# Patient Record
Sex: Female | Born: 1975 | Hispanic: No | Marital: Married | Smoking: Never smoker
Health system: Southern US, Community
[De-identification: ages and names within clinical notes are randomized; demographics above are authoritative.]

## PROBLEM LIST (undated history)

## (undated) DIAGNOSIS — D219 Benign neoplasm of connective and other soft tissue, unspecified: Secondary | ICD-10-CM

## (undated) DIAGNOSIS — Z3483 Encounter for supervision of other normal pregnancy, third trimester: Secondary | ICD-10-CM

## (undated) DIAGNOSIS — O09529 Supervision of elderly multigravida, unspecified trimester: Secondary | ICD-10-CM

## (undated) DIAGNOSIS — Z8632 Personal history of gestational diabetes: Secondary | ICD-10-CM

## (undated) DIAGNOSIS — Z789 Other specified health status: Secondary | ICD-10-CM

## (undated) DIAGNOSIS — D649 Anemia, unspecified: Secondary | ICD-10-CM

## (undated) DIAGNOSIS — O09299 Supervision of pregnancy with other poor reproductive or obstetric history, unspecified trimester: Secondary | ICD-10-CM

## (undated) HISTORY — PX: APPENDECTOMY: SHX54

## (undated) HISTORY — DX: Personal history of gestational diabetes: Z86.32

## (undated) HISTORY — DX: Supervision of elderly multigravida, unspecified trimester: O09.529

## (undated) HISTORY — DX: Benign neoplasm of connective and other soft tissue, unspecified: D21.9

## (undated) HISTORY — DX: Supervision of pregnancy with other poor reproductive or obstetric history, unspecified trimester: O09.299

## (undated) HISTORY — DX: Anemia, unspecified: D64.9

## (undated) HISTORY — PX: NO PAST SURGERIES: SHX2092

---

## 2010-07-15 ENCOUNTER — Emergency Department (HOSPITAL_COMMUNITY): Admission: EM | Admit: 2010-07-15 | Discharge: 2010-07-15 | Payer: Self-pay | Admitting: Emergency Medicine

## 2011-01-07 LAB — URINALYSIS, ROUTINE W REFLEX MICROSCOPIC
Glucose, UA: NEGATIVE mg/dL
Hgb urine dipstick: NEGATIVE
Ketones, ur: NEGATIVE mg/dL
pH: 7.5 (ref 5.0–8.0)

## 2011-01-07 LAB — CBC
HCT: 31.9 % — ABNORMAL LOW (ref 36.0–46.0)
Hemoglobin: 10.8 g/dL — ABNORMAL LOW (ref 12.0–15.0)
MCH: 28.7 pg (ref 26.0–34.0)
MCV: 84.5 fL (ref 78.0–100.0)
RBC: 3.77 MIL/uL — ABNORMAL LOW (ref 3.87–5.11)
WBC: 4.6 10*3/uL (ref 4.0–10.5)

## 2011-01-07 LAB — COMPREHENSIVE METABOLIC PANEL
Albumin: 3.4 g/dL — ABNORMAL LOW (ref 3.5–5.2)
Alkaline Phosphatase: 42 U/L (ref 39–117)
BUN: 4 mg/dL — ABNORMAL LOW (ref 6–23)
CO2: 27 mEq/L (ref 19–32)
Chloride: 108 mEq/L (ref 96–112)
GFR calc non Af Amer: >60 mL/min (ref >60)
Glucose, Bld: 98 mg/dL (ref 70–99)
Potassium: 3.5 mEq/L (ref 3.5–5.1)
Total Bilirubin: 0.5 mg/dL (ref 0.3–1.2)

## 2011-01-07 LAB — DIFFERENTIAL
Basophils Absolute: 0 10*3/uL (ref 0.0–0.1)
Basophils Relative: 1 % (ref 0–1)
Monocytes Absolute: 0.3 10*3/uL (ref 0.1–1.0)
Neutro Abs: 3.2 10*3/uL (ref 1.7–7.7)
Neutrophils Relative %: 71 % (ref 43–77)

## 2011-01-07 LAB — URINE CULTURE

## 2011-01-07 LAB — URINE MICROSCOPIC-ADD ON

## 2011-06-13 ENCOUNTER — Emergency Department (HOSPITAL_COMMUNITY)
Admission: EM | Admit: 2011-06-13 | Discharge: 2011-06-13 | Disposition: A | Discharge status: Home / Self Care | Payer: Managed Care, Other (non HMO) | Attending: Emergency Medicine | Admitting: Emergency Medicine

## 2011-06-13 ENCOUNTER — Emergency Department (HOSPITAL_COMMUNITY): Payer: Managed Care, Other (non HMO)

## 2011-06-13 DIAGNOSIS — S6000XA Contusion of unspecified finger without damage to nail, initial encounter: Secondary | ICD-10-CM | POA: Insufficient documentation

## 2011-06-13 DIAGNOSIS — W230XXA Caught, crushed, jammed, or pinched between moving objects, initial encounter: Secondary | ICD-10-CM | POA: Insufficient documentation

## 2011-07-13 ENCOUNTER — Inpatient Hospital Stay (HOSPITAL_COMMUNITY): Payer: Managed Care, Other (non HMO)

## 2011-07-13 ENCOUNTER — Other Ambulatory Visit: Payer: Self-pay | Admitting: Family Medicine

## 2011-07-13 ENCOUNTER — Inpatient Hospital Stay (HOSPITAL_COMMUNITY)
Admission: AD | Admit: 2011-07-13 | Discharge: 2011-07-13 | Disposition: A | Discharge status: Home / Self Care | Payer: Managed Care, Other (non HMO) | Source: Ambulatory Visit | Attending: Obstetrics and Gynecology | Admitting: Obstetrics and Gynecology

## 2011-07-13 ENCOUNTER — Encounter (HOSPITAL_COMMUNITY): Payer: Self-pay

## 2011-07-13 DIAGNOSIS — O039 Complete or unspecified spontaneous abortion without complication: Secondary | ICD-10-CM

## 2011-07-13 DIAGNOSIS — O200 Threatened abortion: Secondary | ICD-10-CM | POA: Insufficient documentation

## 2011-07-13 HISTORY — DX: Other specified health status: Z78.9

## 2011-07-13 LAB — ABO/RH: ABO/RH(D): O POS

## 2011-07-13 LAB — CBC
Hemoglobin: 10.7 g/dL — ABNORMAL LOW (ref 12.0–15.0)
MCHC: 34.3 g/dL (ref 30.0–36.0)
WBC: 5.9 10*3/uL (ref 4.0–10.5)

## 2011-07-13 LAB — HCG, QUANTITATIVE, PREGNANCY: hCG, Beta Chain, Quant, S: 10179 m[IU]/mL — ABNORMAL HIGH (ref <5)

## 2011-07-13 MED ORDER — OXYCODONE-ACETAMINOPHEN 5-325 MG PO TABS
1.0000 | ORAL_TABLET | Freq: Once | ORAL | Status: AC
  Administered 2011-07-13: 1 via ORAL
  Filled 2011-07-13: qty 1

## 2011-07-13 NOTE — ED Provider Notes (Signed)
History     Chief Complaint  Patient presents with  . Vaginal Bleeding   HPIArwa Altaher35 y.o. presents with vaginal bleeding in early pregnancy.  G4 P 3003 by LMP would be [redacted]w[redacted]d.  She reports having bleeding X 3 days.  Became heavier last night.  Cramping began yesterday afternoon and has worsened.  She has had 3 term deliveries without complications.      Past Medical History  Diagnosis Date  . No pertinent past medical history     Past Surgical History  Procedure Date  . No past surgeries     No family history on file.  History  Substance Use Topics  . Smoking status: Never Smoker   . Smokeless tobacco: Never Used  . Alcohol Use: No    Allergies: No Known Allergies  No prescriptions prior to admission    Review of Systems  Gastrointestinal: Positive for abdominal pain.  Genitourinary:       Vaginal bleeding   Physical Exam   Blood pressure 122/62, pulse 72, temperature 98.5 F (36.9 C), temperature source Oral, resp. rate 16, height 5' 5.25" (1.657 m), weight 141 lb (63.957 kg).  Physical Exam  Constitutional: She appears well-developed and well-nourished. Distressed: uncomfortable.  HENT:  Head: Normocephalic.  Neck: Neck supple.  Genitourinary:       Exam by S. Holbrook, DO  POC in vaginal vaultt with blood.  Cx is closed.  Right adnexa tender to palpation.   Results for orders placed during the hospital encounter of 07/13/11 (from the past 24 hour(s))  CBC     Status: Abnormal   Collection Time   07/13/11  9:00 AM      Component Value Range   WBC 5.9  4.0 - 10.5 (K/uL)   RBC 3.67 (*) 3.87 - 5.11 (MIL/uL)   Hemoglobin 10.7 (*) 12.0 - 15.0 (g/dL)   HCT 96.0 (*) 45.4 - 46.0 (%)   MCV 85.0  78.0 - 100.0 (fL)   MCH 29.2  26.0 - 34.0 (pg)   MCHC 34.3  30.0 - 36.0 (g/dL)   RDW 09.8  11.9 - 14.7 (%)   Platelets 168  150 - 400 (K/uL)  HCG, QUANTITATIVE, PREGNANCY     Status: Abnormal   Collection Time   07/13/11  9:00 AM      Component Value Range   hCG, Beta Chain, Quant, S 10179 (*) <5 (mIU/mL)  ABO/RH     Status: Normal   Collection Time   07/13/11  9:00 AM      Component Value Range   ABO/RH(D) O POS       ULTRASOUND REPORT;  NO INTRAUTERINE GESTATIONAL SAC, YOLK SAC, FETAL POLE OR CARDIAC ACTIVITY NOTED.  INHOMOGENEOUSLY PROMINENT ENDOMETRIUM MEASURING 2.3CM  MAU Course  Procedures History and physical exam were transferred from written form.. Computers were down.  At 10:15, I assumed care of patient and transferred information to EMR.   Percocet 1 tab po ordered for discomfort.      MDM Dr. Jackelyn Knife paged at 11:15. Called back at 11:23--reported patient's complaint, lab and ultrasound results to Dr. Jackelyn Knife.  Patient to followup in the office.    11;53 went in to re-evaluate pain after percocet.  She states her pain is better, the pain is mostly in her lower back.  Ready for discharge.  She called her husband to pick her up.  Assessment and Plan  A: Inevitable miscarriage  P.  Follow up with Dr. Eligha Bridegroom office.  Patient instructed  to call the office and change her appointment from a New OB to a followup miscarriage.     Dayana Dalporto,EVE M 07/13/2011, 10:16 AM   Matt Holmes, NP 07/13/11 1154

## 2011-07-13 NOTE — Progress Notes (Signed)
Pt states she has been bleeding for 3 days, was heavy last night. Pt is wearing a double pad that is 3/4 saturated with dark red blood. Pad changed and peri care done.

## 2011-10-26 NOTE — L&D Delivery Note (Signed)
Delivery Note Pt pushed and at 3:57 PM a viable female was delivered via  (Presentation: ROA).  APGAR: 8, 9; weight 7#13oz.  Clots delivered with baby consistent with partial abruption. Placenta status:expressed spontaneously, moderate uterine atony responded to massage and IV pitocin. , .  Cord: 3 vessels with the following complications:none . Peds attended delivery for bradycardia, but baby di well and left in room Anesthesia: Epidural  Episiotomy: none Lacerations: none Suture Repair: n/a Est. Blood Loss (mL): 500cc  Mom to postpartum.  Baby to stay in room with mom. Oliver Pila 08/11/2012, 4:17 PM

## 2012-01-26 LAB — OB RESULTS CONSOLE ABO/RH: RH Type: POSITIVE

## 2012-01-26 LAB — OB RESULTS CONSOLE RPR: RPR: NONREACTIVE

## 2012-07-18 ENCOUNTER — Telehealth (HOSPITAL_COMMUNITY): Payer: Self-pay | Admitting: *Deleted

## 2012-07-18 ENCOUNTER — Encounter (HOSPITAL_COMMUNITY): Payer: Self-pay | Admitting: *Deleted

## 2012-07-18 NOTE — Telephone Encounter (Signed)
Preadmission screen  

## 2012-07-19 MED ORDER — TERBUTALINE SULFATE 1 MG/ML IJ SOLN
0.2500 mg | Freq: Once | INTRAMUSCULAR | Status: DC
  Filled 2012-07-19: qty 1

## 2012-07-19 MED ORDER — LACTATED RINGERS IV SOLN
INTRAVENOUS | Status: DC
  Filled 2012-07-19: qty 1000

## 2012-07-19 NOTE — H&P (Signed)
Sabrina [name] is a 36 y.o. female [id number] at 68 6/7 weeks for attempted version   (EDD 08/11/12 by 8 week [place])  Prenatal care uncomplicated except interrupted from18-32 weeks when was home in [place].  Had some PNC with family member only until returned.  Prior GDM, but this pregnacy only elevated one hour, three hour WNL.  Marland KitchenHistory OB History    Grav Para Term Preterm Abortions TAB SAB Ect Mult Living   5 3 3  0 1 0 1 0 0 3    2003 NSVD 6#6oz 2007 NSVD 8#8oz 2010 NSVD 8#8oz 2012 SAB  Past Medical History  Diagnosis Date  . No pertinent past medical history   . Hx of gestational diabetes in prior pregnancy, currently pregnant    Past Surgical History  Procedure Date  . No past surgeries    Family History: family history includes Depression in her maternal grandfather, maternal grandmother, paternal grandfather, and paternal grandmother; Heart attack in her father; and Hypertension in her father, mother, and sister. Social History:  reports that she has never smoked. She has never used smokeless tobacco. She reports that she does not drink alcohol or use illicit drugs.   Prenatal Transfer Tool  Maternal Diabetes: No Genetic Screening: Normal Maternal Ultrasounds/Referrals: Normal Fetal Ultrasounds or other Referrals:  None Maternal Substance Abuse:  No Significant Maternal Medications:  None Significant Maternal Lab Results:  None Other Comments:  Pt was in [place] fro m18-32 weeks   ROS    Last menstrual period 11/11/2011. Maternal Exam:  Uterine Assessment: Contraction strength is mild.  Contraction frequency is irregular.   Abdomen: Patient reports no abdominal tenderness. Fetal presentation: breech     Physical Exam  Constitutional: She is oriented to person, place, and time. She appears well-developed and well-nourished.  Cardiovascular: Normal rate and regular rhythm.   Respiratory: Effort normal.  GI: Soft.  Neurological: She is alert and oriented to person, place,  and time.    Prenatal labs: ABO, Rh: O/Positive/-- (04/03 0000) Antibody: Negative (04/03 0000) Rubella: Immune (04/03 0000) RPR: Nonreactive (04/03 0000)  HBsAg: Negative (04/03 0000)  HIV: Non-reactive (04/03 0000)  GBS:   negative One hour at 28 weeks 159, three hor ENL Hgb AA  First trimester screen and AFP WNL  Assessment/Plan: Pt for trial of version.  Counseled on risks of procedure including fetalo distress and possible need for immediate C-section in that event.  SHe has had a version before with her last pregnancy.  Oliver Pila 07/19/2012, 9:57 PM

## 2012-07-20 ENCOUNTER — Telehealth (HOSPITAL_COMMUNITY): Payer: Self-pay | Admitting: *Deleted

## 2012-07-20 ENCOUNTER — Inpatient Hospital Stay (HOSPITAL_COMMUNITY)
Admission: RE | Admit: 2012-07-20 | Discharge: 2012-07-20 | Payer: Managed Care, Other (non HMO) | Source: Ambulatory Visit | Attending: Obstetrics and Gynecology | Admitting: Obstetrics and Gynecology

## 2012-07-20 NOTE — Telephone Encounter (Signed)
Preadmission screen  

## 2012-07-25 ENCOUNTER — Inpatient Hospital Stay (HOSPITAL_COMMUNITY)
Admission: RE | Admit: 2012-07-25 | Payer: Managed Care, Other (non HMO) | Source: Ambulatory Visit | Admitting: Obstetrics and Gynecology

## 2012-08-01 ENCOUNTER — Telehealth (HOSPITAL_COMMUNITY): Payer: Self-pay | Admitting: *Deleted

## 2012-08-01 NOTE — Telephone Encounter (Signed)
Preadmission screen  

## 2012-08-10 NOTE — H&P (Signed)
Sabrina [name] is a 36 y.o. female [id number] at 40 weeks (EDD 08/11/12 by 8 week [place])  presenting for IOL at term.  Prenatal care complicated by breech presentation at 36 weeks that spontaneously verted at 37 weeks and has remained vertex. She had GDM with one of her other pregnancies but not with this one. Her prenatal care was interrupted by a long trip to [place], but was resumed on her return at 32 weeks.  History OB History    Grav Para Term Preterm Abortions TAB SAB Ect Mult Living   5 3 3  0 1 0 1 0 0 3    NSVD x 3 2003, 2007, 2010  6#-8#8oz Past Medical History  Diagnosis Date  . No pertinent past medical history   . Hx of gestational diabetes in prior pregnancy, currently pregnant    Past Surgical History  Procedure Date  . No past surgeries    Family History: family history includes Depression in her maternal grandfather, maternal grandmother, paternal grandfather, and paternal grandmother; Heart attack in her father; and Hypertension in her father, mother, and sister. Social History:  reports that she has never smoked. She has never used smokeless tobacco. She reports that she does not drink alcohol or use illicit drugs.   Prenatal Transfer Tool  Maternal Diabetes: No Genetic Screening: Normal Maternal Ultrasounds/Referrals: Normal Fetal Ultrasounds or other Referrals:  None Maternal Substance Abuse:  No Significant Maternal Medications:  None Significant Maternal Lab Results:  None Other Comments:  None  ROS    Last menstrual period 11/11/2011. Exam Physical Exam  Constitutional: She is oriented to person, place, and time. She appears well-developed and well-nourished.  Cardiovascular: Normal rate and regular rhythm.   Respiratory: Effort normal and breath sounds normal.  GI: Soft.  Genitourinary: Vagina normal.  Neurological: She is alert and oriented to person, place, and time.  Psychiatric: She has a normal mood and affect. Her behavior is normal.    Prenatal  labs: ABO, Rh: O/Positive/-- (04/03 0000) Antibody: Negative (04/03 0000) Rubella: Immune (04/03 0000) RPR: Nonreactive (04/03 0000)  HBsAg: Negative (04/03 0000)  HIV: Non-reactive (04/03 0000)  GBS:   negative One hour GTT at 33 weeks 159 Three hour GTT WNL First trimester screen WNL Hgb AA  Assessment/Plan: Pt for IOL at term.  Plan pitocin followed by AROM when vertex well-applied.  Oliver Pila 08/10/2012, 10:15 PM

## 2012-08-11 ENCOUNTER — Encounter (HOSPITAL_COMMUNITY): Payer: Self-pay | Admitting: Anesthesiology

## 2012-08-11 ENCOUNTER — Encounter (HOSPITAL_COMMUNITY): Payer: Self-pay

## 2012-08-11 ENCOUNTER — Inpatient Hospital Stay (HOSPITAL_COMMUNITY)
Admission: RE | Admit: 2012-08-11 | Discharge: 2012-08-13 | DRG: 774 | Disposition: A | Discharge status: Home / Self Care | Payer: 59 | Source: Ambulatory Visit | Attending: Obstetrics and Gynecology | Admitting: Obstetrics and Gynecology

## 2012-08-11 ENCOUNTER — Inpatient Hospital Stay (HOSPITAL_COMMUNITY): Payer: 59 | Admitting: Anesthesiology

## 2012-08-11 DIAGNOSIS — O09529 Supervision of elderly multigravida, unspecified trimester: Secondary | ICD-10-CM | POA: Diagnosis present

## 2012-08-11 DIAGNOSIS — O321XX0 Maternal care for breech presentation, not applicable or unspecified: Principal | ICD-10-CM | POA: Diagnosis present

## 2012-08-11 DIAGNOSIS — O99814 Abnormal glucose complicating childbirth: Secondary | ICD-10-CM | POA: Diagnosis present

## 2012-08-11 DIAGNOSIS — O4590 Premature separation of placenta, unspecified, unspecified trimester: Secondary | ICD-10-CM | POA: Diagnosis present

## 2012-08-11 LAB — CBC
HCT: 28.8 % — ABNORMAL LOW (ref 36.0–46.0)
Hemoglobin: 8.8 g/dL — ABNORMAL LOW (ref 12.0–15.0)
MCH: 21.3 pg — ABNORMAL LOW (ref 26.0–34.0)
MCHC: 30.6 g/dL (ref 30.0–36.0)
MCV: 69.6 fL — ABNORMAL LOW (ref 78.0–100.0)
Platelets: 209 K/uL (ref 150–400)
RBC: 4.14 MIL/uL (ref 3.87–5.11)
RDW: 17.9 % — ABNORMAL HIGH (ref 11.5–15.5)
WBC: 9 K/uL (ref 4.0–10.5)

## 2012-08-11 LAB — TYPE AND SCREEN
ABO/RH(D): O POS
Antibody Screen: NEGATIVE

## 2012-08-11 MED ORDER — ONDANSETRON HCL 4 MG/2ML IJ SOLN: 4.0000 mg | INTRAMUSCULAR | Status: DC | PRN

## 2012-08-11 MED ORDER — EPHEDRINE 5 MG/ML INJ
10.0000 mg | INTRAVENOUS | Status: DC | PRN
  Filled 2012-08-11: qty 4

## 2012-08-11 MED ORDER — OXYTOCIN 40 UNITS IN LACTATED RINGERS INFUSION - SIMPLE MED
1.0000 m[IU]/min | INTRAVENOUS | Status: DC
  Administered 2012-08-11: 2 m[IU]/min via INTRAVENOUS
  Filled 2012-08-11: qty 1000

## 2012-08-11 MED ORDER — PHENYLEPHRINE 40 MCG/ML (10ML) SYRINGE FOR IV PUSH (FOR BLOOD PRESSURE SUPPORT): 80.0000 ug | PREFILLED_SYRINGE | INTRAVENOUS | Status: DC | PRN

## 2012-08-11 MED ORDER — OXYTOCIN BOLUS FROM INFUSION
500.0000 mL | INTRAVENOUS | Status: DC
  Filled 2012-08-11 (×61): qty 500

## 2012-08-11 MED ORDER — DIPHENHYDRAMINE HCL 25 MG PO CAPS: 25.0000 mg | ORAL_CAPSULE | Freq: Four times a day (QID) | ORAL | Status: DC | PRN

## 2012-08-11 MED ORDER — OXYCODONE-ACETAMINOPHEN 5-325 MG PO TABS
1.0000 | ORAL_TABLET | ORAL | Status: DC | PRN
  Administered 2012-08-11 (×2): 1 via ORAL
  Filled 2012-08-11 (×2): qty 1

## 2012-08-11 MED ORDER — ZOLPIDEM TARTRATE 5 MG PO TABS: 5.0000 mg | ORAL_TABLET | Freq: Every evening | ORAL | Status: DC | PRN

## 2012-08-11 MED ORDER — TETANUS-DIPHTH-ACELL PERTUSSIS 5-2.5-18.5 LF-MCG/0.5 IM SUSP
0.5000 mL | Freq: Once | INTRAMUSCULAR | Status: AC
  Administered 2012-08-12: 0.5 mL via INTRAMUSCULAR
  Filled 2012-08-11: qty 0.5

## 2012-08-11 MED ORDER — LIDOCAINE HCL (PF) 1 % IJ SOLN: 30.0000 mL | INTRAMUSCULAR | Status: DC | PRN

## 2012-08-11 MED ORDER — LACTATED RINGERS IV SOLN
500.0000 mL | Freq: Once | INTRAVENOUS | Status: AC
  Administered 2012-08-11: 500 mL via INTRAVENOUS

## 2012-08-11 MED ORDER — PHENYLEPHRINE 40 MCG/ML (10ML) SYRINGE FOR IV PUSH (FOR BLOOD PRESSURE SUPPORT)
80.0000 ug | PREFILLED_SYRINGE | INTRAVENOUS | Status: DC | PRN
  Filled 2012-08-11: qty 5

## 2012-08-11 MED ORDER — IBUPROFEN 600 MG PO TABS
600.0000 mg | ORAL_TABLET | Freq: Four times a day (QID) | ORAL | Status: DC
  Administered 2012-08-11 – 2012-08-13 (×8): 600 mg via ORAL
  Filled 2012-08-11 (×8): qty 1

## 2012-08-11 MED ORDER — PRENATAL MULTIVITAMIN CH
1.0000 | ORAL_TABLET | Freq: Every day | ORAL | Status: DC
  Administered 2012-08-11 – 2012-08-13 (×3): 1 via ORAL
  Filled 2012-08-11 (×3): qty 1

## 2012-08-11 MED ORDER — TERBUTALINE SULFATE 1 MG/ML IJ SOLN
0.2500 mg | Freq: Once | INTRAMUSCULAR | Status: DC | PRN
  Filled 2012-08-11: qty 1

## 2012-08-11 MED ORDER — LIDOCAINE HCL (PF) 1 % IJ SOLN
INTRAMUSCULAR | Status: DC | PRN
  Administered 2012-08-11 (×2): 9 mL

## 2012-08-11 MED ORDER — CITRIC ACID-SODIUM CITRATE 334-500 MG/5ML PO SOLN
30.0000 mL | ORAL | Status: DC | PRN
  Administered 2012-08-11: 30 mL via ORAL
  Filled 2012-08-11: qty 15

## 2012-08-11 MED ORDER — LANOLIN HYDROUS EX OINT: TOPICAL_OINTMENT | CUTANEOUS | Status: DC | PRN

## 2012-08-11 MED ORDER — SIMETHICONE 80 MG PO CHEW: 80.0000 mg | CHEWABLE_TABLET | ORAL | Status: DC | PRN

## 2012-08-11 MED ORDER — OXYCODONE-ACETAMINOPHEN 5-325 MG PO TABS: 1.0000 | ORAL_TABLET | ORAL | Status: DC | PRN

## 2012-08-11 MED ORDER — ONDANSETRON HCL 4 MG/2ML IJ SOLN: 4.0000 mg | Freq: Four times a day (QID) | INTRAMUSCULAR | Status: DC | PRN

## 2012-08-11 MED ORDER — ONDANSETRON HCL 4 MG PO TABS: 4.0000 mg | ORAL_TABLET | ORAL | Status: DC | PRN

## 2012-08-11 MED ORDER — OXYTOCIN 40 UNITS IN LACTATED RINGERS INFUSION - SIMPLE MED
62.5000 mL/h | INTRAVENOUS | Status: DC
  Administered 2012-08-11: 62.5 mL/h via INTRAVENOUS

## 2012-08-11 MED ORDER — DIBUCAINE 1 % RE OINT: 1.0000 "application " | TOPICAL_OINTMENT | RECTAL | Status: DC | PRN

## 2012-08-11 MED ORDER — WITCH HAZEL-GLYCERIN EX PADS: 1.0000 "application " | MEDICATED_PAD | CUTANEOUS | Status: DC | PRN

## 2012-08-11 MED ORDER — DIPHENHYDRAMINE HCL 50 MG/ML IJ SOLN: 12.5000 mg | INTRAMUSCULAR | Status: DC | PRN

## 2012-08-11 MED ORDER — FENTANYL 2.5 MCG/ML BUPIVACAINE 1/10 % EPIDURAL INFUSION (WH - ANES)
INTRAMUSCULAR | Status: DC | PRN
  Administered 2012-08-11: 15 mL/h via EPIDURAL

## 2012-08-11 MED ORDER — BUTORPHANOL TARTRATE 1 MG/ML IJ SOLN: 1.0000 mg | INTRAMUSCULAR | Status: DC | PRN

## 2012-08-11 MED ORDER — BENZOCAINE-MENTHOL 20-0.5 % EX AERO: 1.0000 "application " | INHALATION_SPRAY | CUTANEOUS | Status: DC | PRN

## 2012-08-11 MED ORDER — EPHEDRINE 5 MG/ML INJ: 10.0000 mg | INTRAVENOUS | Status: DC | PRN

## 2012-08-11 MED ORDER — IBUPROFEN 600 MG PO TABS: 600.0000 mg | ORAL_TABLET | Freq: Four times a day (QID) | ORAL | Status: DC | PRN

## 2012-08-11 MED ORDER — LACTATED RINGERS IV SOLN
INTRAVENOUS | Status: DC
  Administered 2012-08-11: 13:00:00 via INTRAVENOUS
  Administered 2012-08-11: 125 mL/h via INTRAVENOUS

## 2012-08-11 MED ORDER — FENTANYL 2.5 MCG/ML BUPIVACAINE 1/10 % EPIDURAL INFUSION (WH - ANES)
14.0000 mL/h | INTRAMUSCULAR | Status: DC
  Filled 2012-08-11: qty 125

## 2012-08-11 MED ORDER — LACTATED RINGERS IV SOLN: 500.0000 mL | INTRAVENOUS | Status: DC | PRN

## 2012-08-11 MED ORDER — ACETAMINOPHEN 325 MG PO TABS: 650.0000 mg | ORAL_TABLET | ORAL | Status: DC | PRN

## 2012-08-11 MED ORDER — SENNOSIDES-DOCUSATE SODIUM 8.6-50 MG PO TABS
2.0000 | ORAL_TABLET | Freq: Every day | ORAL | Status: DC
  Administered 2012-08-11 – 2012-08-12 (×2): 2 via ORAL

## 2012-08-11 NOTE — Progress Notes (Signed)
Patient ID: Sabrina [name], female   DOB: 1976-09-21, 36 y.o.   MRN: [id number] CTSP for decel to 60's with partial recovery to 90's. On my arrival pt c/reducible rim/0 and FHR fluctuating from 80-110. Pt prepped and instructed on pushing.  Pushed well with coaching and after about 5-10 minutes vertex moving well and delivered.

## 2012-08-11 NOTE — Anesthesia Procedure Notes (Signed)
Epidural Patient location during procedure: OB Start time: 08/11/2012 12:39 PM End time: 08/11/2012 12:43 PM  Staffing Anesthesiologist: Sandrea Hughs Performed by: anesthesiologist   Preanesthetic Checklist Completed: patient identified, site marked, surgical consent, pre-op evaluation, timeout performed, IV checked, risks and benefits discussed and monitors and equipment checked  Epidural Patient position: sitting Prep: site prepped and draped and DuraPrep Patient monitoring: continuous pulse ox and blood pressure Approach: midline Injection technique: LOR air  Needle:  Needle type: Tuohy  Needle gauge: 17 G Needle length: 9 cm and 9 Needle insertion depth: 5 cm cm Catheter type: closed end flexible Catheter size: 19 Gauge Catheter at skin depth: 10 cm Test dose: negative and Other  Assessment Sensory level: T9 Events: blood not aspirated, injection not painful, no injection resistance, negative IV test and no paresthesia  Additional Notes Reason for block:procedure for pain

## 2012-08-11 NOTE — Progress Notes (Signed)
   Subjective: Pt comfortable with epidural  Objective: BP 124/77  Pulse 81  Temp 97.6 F (36.4 C) (Axillary)  Resp 16  Ht 5' 6.93" (1.7 m)  Wt 68 kg (149 lb 14.6 oz)  BMI 23.53 kg/m2  SpO2 100%  LMP 11/11/2011      FHT:  FHR: 135 bpm, variability: moderate,  accelerations:  Present,  decelerations:  Absent UC:   regular, every 2-3 minutes SVE:   3-4/50/-1 Light meconium stained fluid  Labs: Lab Results  Component Value Date   WBC 9.0 08/11/2012   HGB 8.8* 08/11/2012   HCT 28.8* 08/11/2012   MCV 69.6* 08/11/2012   PLT 209 08/11/2012    Assessment / Plan: Pitocin at 8 mu, IUPC placed to adjust Torunn Chancellor W 08/11/2012, 1:12 PM

## 2012-08-11 NOTE — Anesthesia Preprocedure Evaluation (Signed)
Anesthesia Evaluation  Patient identified by MRN, date of birth, ID band Patient awake    Reviewed: Allergy & Precautions, H&P , NPO status , Patient's Chart, lab work & pertinent test results  Airway Mallampati: I TM Distance: >3 FB Neck ROM: full    Dental No notable dental hx.    Pulmonary neg pulmonary ROS,  breath sounds clear to auscultation  Pulmonary exam normal       Cardiovascular negative cardio ROS      Neuro/Psych negative neurological ROS  negative psych ROS   GI/Hepatic negative GI ROS, Neg liver ROS,   Endo/Other  negative endocrine ROS  Renal/GU negative Renal ROS  negative genitourinary   Musculoskeletal negative musculoskeletal ROS (+)   Abdominal Normal abdominal exam  (+)   Peds negative pediatric ROS (+)  Hematology negative hematology ROS (+)   Anesthesia Other Findings   Reproductive/Obstetrics (+) Pregnancy                           Anesthesia Physical Anesthesia Plan  ASA: II  Anesthesia Plan: Epidural   Post-op Pain Management:    Induction:   Airway Management Planned:   Additional Equipment:   Intra-op Plan:   Post-operative Plan:   Informed Consent: I have reviewed the patients History and Physical, chart, labs and discussed the procedure including the risks, benefits and alternatives for the proposed anesthesia with the patient or authorized representative who has indicated his/her understanding and acceptance.     Plan Discussed with:   Anesthesia Plan Comments:         Anesthesia Quick Evaluation  

## 2012-08-11 NOTE — Progress Notes (Signed)
Patient ID: Sabrina [name], female   DOB: [birth year], 36 y.o.   MRN: [id number] Pt admitted with occasional ctx.  Starting on pitocin EFM reassuring Cervix 50/2/-2  AROM with FSE due to being so posterior. Will follow progress.

## 2012-08-11 NOTE — Progress Notes (Signed)
Pt unable to void I/O cath on toliet

## 2012-08-12 LAB — CBC
HCT: 23.8 % — ABNORMAL LOW (ref 36.0–46.0)
Hemoglobin: 7.3 g/dL — ABNORMAL LOW (ref 12.0–15.0)
MCH: 21.3 pg — ABNORMAL LOW (ref 26.0–34.0)
MCV: 69.6 fL — ABNORMAL LOW (ref 78.0–100.0)
RBC: 3.42 MIL/uL — ABNORMAL LOW (ref 3.87–5.11)

## 2012-08-12 MED ORDER — INFLUENZA VIRUS VACC SPLIT PF IM SUSP
0.5000 mL | INTRAMUSCULAR | Status: AC
  Administered 2012-08-12: 0.5 mL via INTRAMUSCULAR
  Filled 2012-08-12: qty 0.5

## 2012-08-12 NOTE — Anesthesia Postprocedure Evaluation (Signed)
  Anesthesia Post-op Note  Patient: Sabrina [name]  Procedure(s) Performed: * No procedures listed *  Patient Location: Mother/Baby  Anesthesia Type: Epidural  Level of Consciousness: awake  Airway and Oxygen Therapy: Patient Spontanous Breathing  Post-op Pain: mild  Post-op Assessment: Patient's Cardiovascular Status Stable and Respiratory Function Stable  Post-op Vital Signs: stable  Complications: No apparent anesthesia complications

## 2012-08-12 NOTE — Progress Notes (Signed)
Post Partum Day 1 Subjective: no complaints, up ad lib and tolerating PO  Objective: Blood pressure 99/56, pulse 80, temperature 98.1 F (36.7 C), temperature source Oral, resp. rate 18, height 5' 6.93" (1.7 m), weight 68 kg (149 lb 14.6 oz), last menstrual period 11/11/2011, SpO2 100.00%, unknown if currently breastfeeding.  Physical Exam:  General: alert and cooperative Lochia: appropriate Uterine Fundus: firm    Basename 08/12/12 0500 08/11/12 0850  HGB 7.3* 8.8*  HCT 23.8* 28.8*    Assessment/Plan: Discharge home tomorrow   LOS: 1 day   Lian Tanori W 08/12/2012, 10:41 AM

## 2012-08-13 MED ORDER — IBUPROFEN 600 MG PO TABS: 600.0000 mg | ORAL_TABLET | Freq: Four times a day (QID) | ORAL | Status: DC

## 2012-08-13 MED ORDER — OXYCODONE-ACETAMINOPHEN 5-325 MG PO TABS: 1.0000 | ORAL_TABLET | ORAL | Status: DC | PRN

## 2012-08-13 NOTE — Progress Notes (Signed)
Post Partum Day 2 Subjective: no complaints, up ad lib and tolerating PO  Objective: Blood pressure 111/67, pulse 72, temperature 97.2 F (36.2 C), temperature source Oral, resp. rate 16, height 5' 6.93" (1.7 m), weight 68 kg (149 lb 14.6 oz), last menstrual period 11/11/2011, SpO2 99.00%, unknown if currently breastfeeding.  Physical Exam:  General: alert and cooperative Lochia: appropriate Uterine Fundus: firm    Basename 08/12/12 0500 08/11/12 0850  HGB 7.3* 8.8*  HCT 23.8* 28.8*    Assessment/Plan: Discharge home Motrin and percocet Plans micronor for birth control   LOS: 2 days   Wilma Wuthrich W 08/13/2012, 11:08 AM

## 2012-08-13 NOTE — Discharge Summary (Signed)
Obstetric Discharge Summary Reason for Admission: induction of labor Prenatal Procedures: none Intrapartum Procedures: spontaneous vaginal delivery Postpartum Procedures: none Complications-Operative and Postpartum: none Hemoglobin  Date Value Range Status  08/12/2012 7.3* 12.0 - 15.0 g/dL Final     DELTA CHECK NOTED     REPEATED TO VERIFY     HCT  Date Value Range Status  08/12/2012 23.8* 36.0 - 46.0 % Final    Physical Exam:  General: alert and cooperative Lochia: appropriate Uterine Fundus: firm   Discharge Diagnoses: Term Pregnancy-delivered  Discharge Information: Date: 08/13/2012 Activity: pelvic rest Diet: routine Medications: Ibuprofen Condition: improved Instructions: refer to practice specific booklet Discharge to: home Follow-up Information    Follow up with Sabrina Pila, MD. Schedule an appointment as soon as possible for a visit in 6 weeks.   Contact information:   [address], [address] [place] [address] [phone]          Newborn Data: Live born female  Birth Weight: 7 lb 13.4 oz (3555 g) APGAR: 8, 9  Home with mother.  Sabrina [name] 08/13/2012, 11:11 AM

## 2013-03-29 IMAGING — CR DG FINGER THUMB 2+V*L*
3 series · 3 of 3 positions shown · non-contrast
Comparison: None

CLINICAL DATA: Injury to the distal left thumb.

LEFT THUMB 2+V

[x finger pa left]
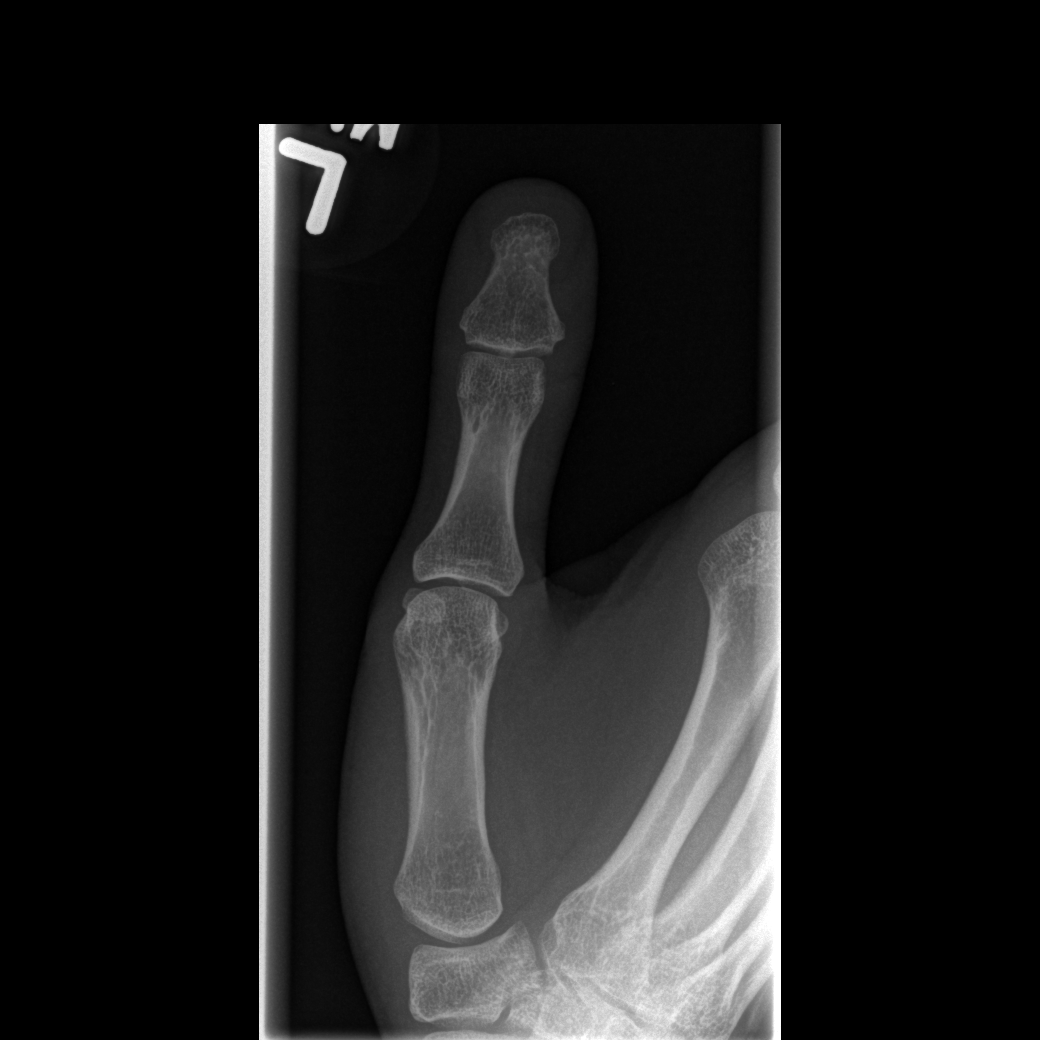

[x finger obl. left]
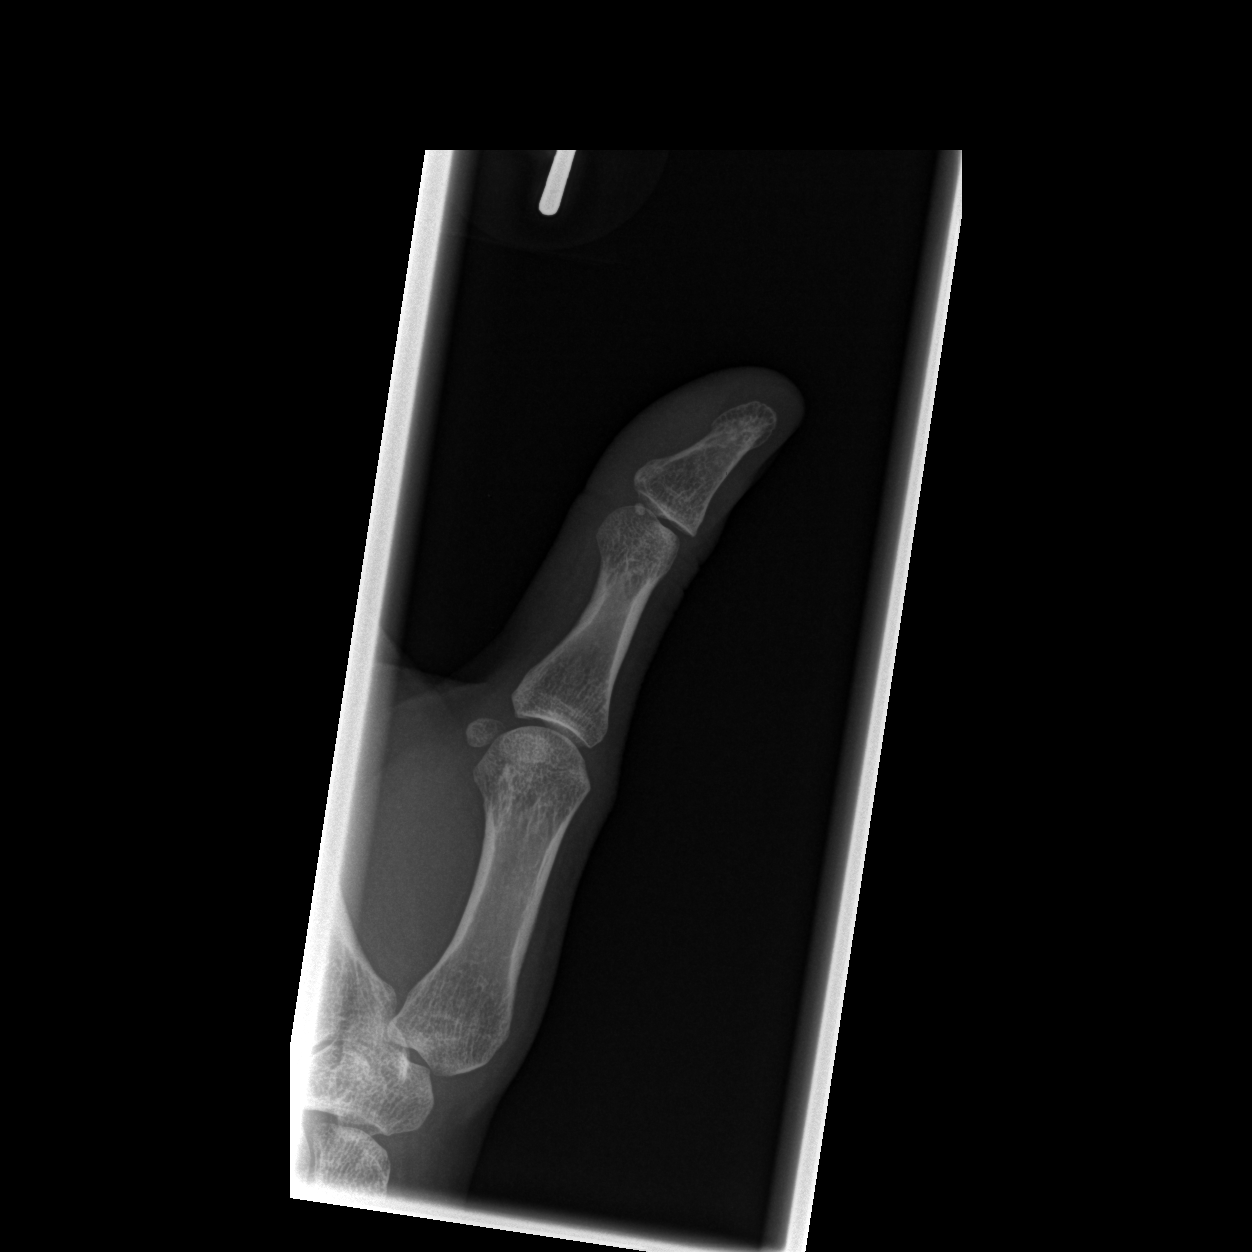

[x finger lateral left]
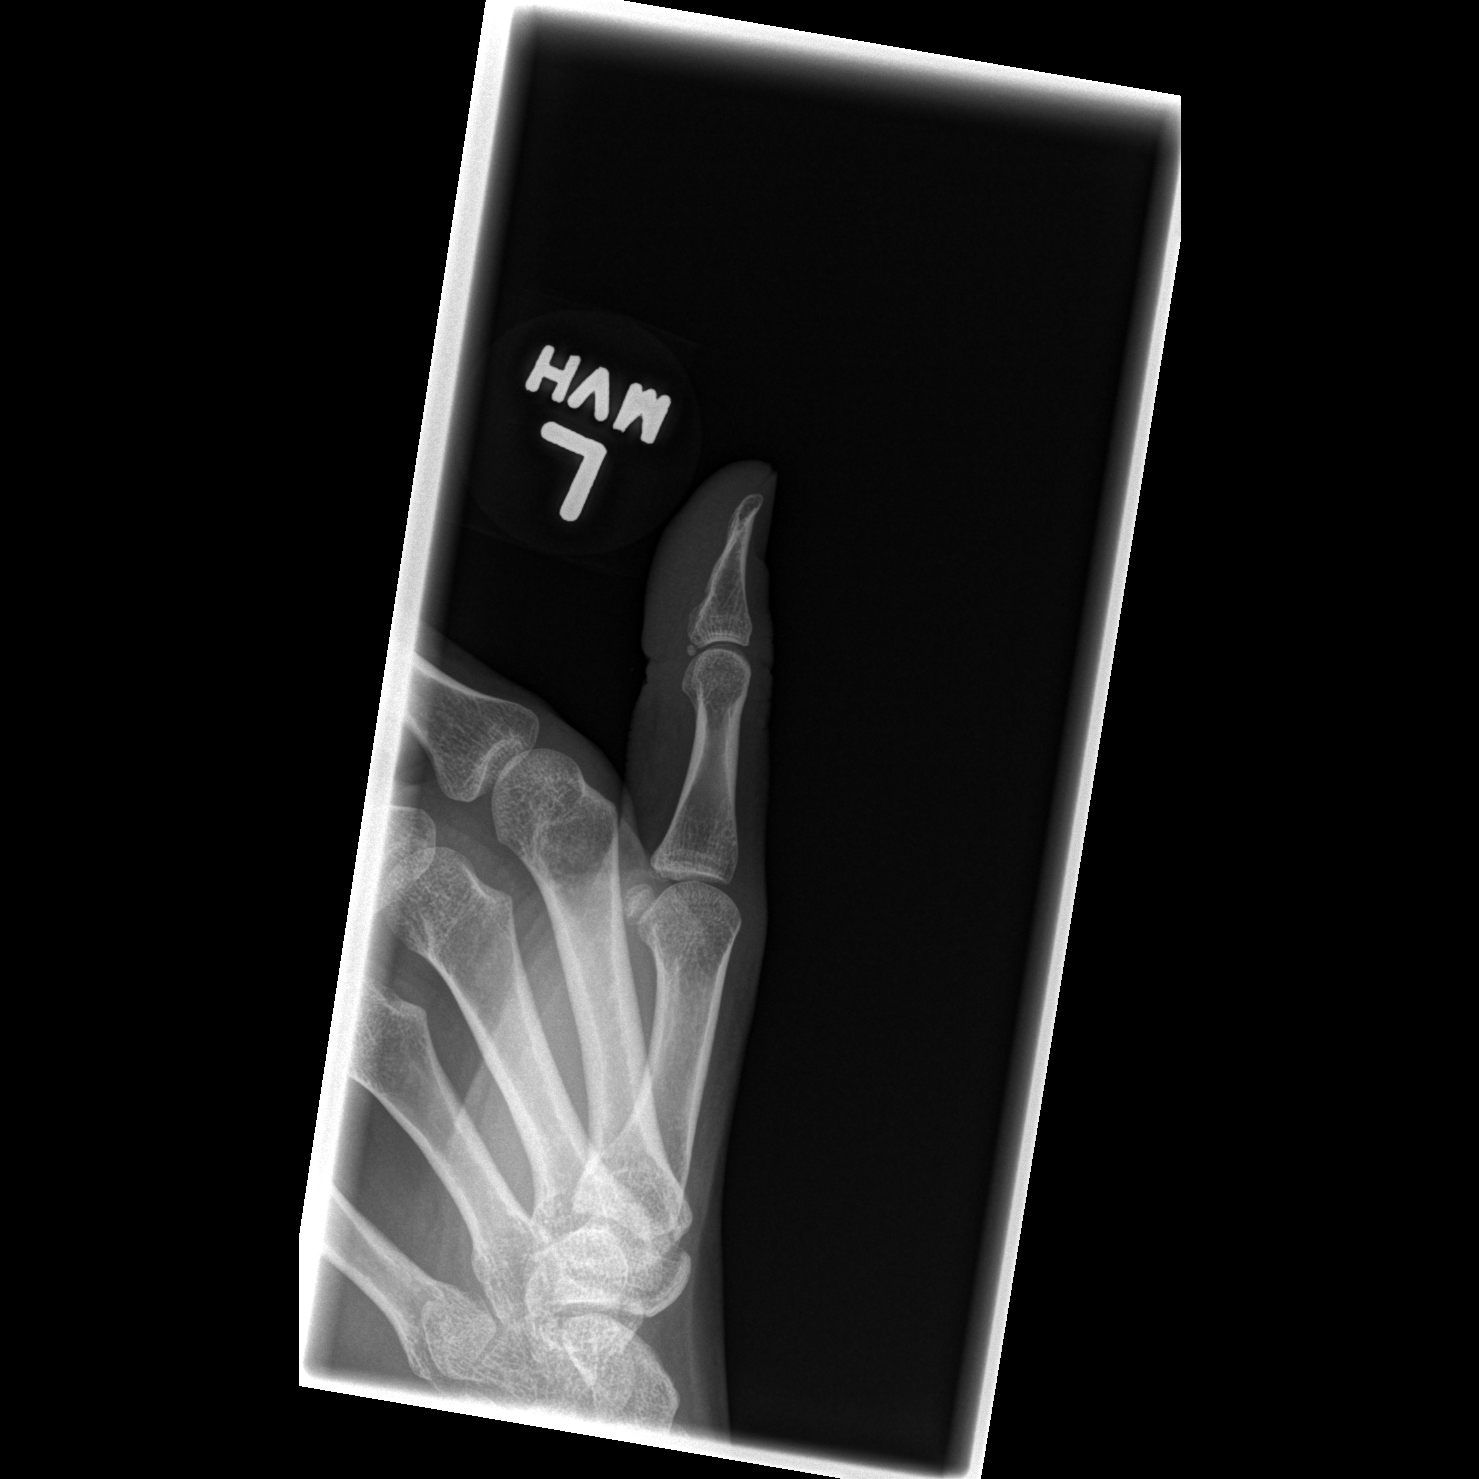

[3 of 3 positions shown; findings below may reference images not displayed]

FINDINGS: Three views of the left thumb were obtained.  Normal
alignment of the left thumb.  Negative for acute fracture or
dislocation.
IMPRESSION: No acute osseous abnormality to the left thumb.

## 2014-05-28 ENCOUNTER — Encounter (HOSPITAL_COMMUNITY): Payer: Self-pay | Admitting: Emergency Medicine

## 2014-05-28 ENCOUNTER — Emergency Department (HOSPITAL_COMMUNITY): Payer: Medicaid Other | Admitting: Anesthesiology

## 2014-05-28 ENCOUNTER — Inpatient Hospital Stay (HOSPITAL_COMMUNITY)
Admission: EM | Admit: 2014-05-28 | Discharge: 2014-05-30 | DRG: 341 | Disposition: A | Discharge status: Home / Self Care | Payer: Medicaid Other | Attending: General Surgery | Admitting: General Surgery

## 2014-05-28 ENCOUNTER — Encounter (HOSPITAL_COMMUNITY): Admission: EM | Disposition: A | Discharge status: Home / Self Care | Payer: Self-pay | Source: Home / Self Care

## 2014-05-28 ENCOUNTER — Emergency Department (HOSPITAL_COMMUNITY): Payer: Medicaid Other

## 2014-05-28 ENCOUNTER — Encounter (HOSPITAL_COMMUNITY): Payer: Medicaid Other | Admitting: Anesthesiology

## 2014-05-28 DIAGNOSIS — K353 Acute appendicitis with localized peritonitis, without perforation or gangrene: Secondary | ICD-10-CM

## 2014-05-28 DIAGNOSIS — K3580 Unspecified acute appendicitis: Secondary | ICD-10-CM

## 2014-05-28 DIAGNOSIS — R51 Headache: Secondary | ICD-10-CM | POA: Diagnosis present

## 2014-05-28 DIAGNOSIS — K659 Peritonitis, unspecified: Secondary | ICD-10-CM | POA: Diagnosis present

## 2014-05-28 DIAGNOSIS — R1031 Right lower quadrant pain: Secondary | ICD-10-CM

## 2014-05-28 DIAGNOSIS — R112 Nausea with vomiting, unspecified: Secondary | ICD-10-CM

## 2014-05-28 DIAGNOSIS — Z8249 Family history of ischemic heart disease and other diseases of the circulatory system: Secondary | ICD-10-CM

## 2014-05-28 HISTORY — PX: LAPAROSCOPIC APPENDECTOMY: SHX408

## 2014-05-28 LAB — COMPREHENSIVE METABOLIC PANEL
ALBUMIN: 4 g/dL (ref 3.5–5.2)
ALT: 12 U/L (ref 0–35)
ANION GAP: 14 (ref 5–15)
AST: 16 U/L (ref 0–37)
Alkaline Phosphatase: 64 U/L (ref 39–117)
BILIRUBIN TOTAL: 0.6 mg/dL (ref 0.3–1.2)
BUN: 6 mg/dL (ref 6–23)
CHLORIDE: 99 meq/L (ref 96–112)
CO2: 24 mEq/L (ref 19–32)
CREATININE: 0.61 mg/dL (ref 0.50–1.10)
Calcium: 9.9 mg/dL (ref 8.4–10.5)
GFR calc non Af Amer: >90 mL/min (ref >90)
GLUCOSE: 115 mg/dL — AB (ref 70–99)
Potassium: 3.8 mEq/L (ref 3.7–5.3)
Sodium: 137 mEq/L (ref 137–147)
Total Protein: 7.9 g/dL (ref 6.0–8.3)

## 2014-05-28 LAB — CBC WITH DIFFERENTIAL/PLATELET
BASOS PCT: 0 % (ref 0–1)
Basophils Absolute: 0 10*3/uL (ref 0.0–0.1)
EOS ABS: 0 10*3/uL (ref 0.0–0.7)
Eosinophils Relative: 0 % (ref 0–5)
HEMATOCRIT: 36.2 % (ref 36.0–46.0)
HEMOGLOBIN: 11.7 g/dL — AB (ref 12.0–15.0)
LYMPHS ABS: 0.8 10*3/uL (ref 0.7–4.0)
Lymphocytes Relative: 6 % — ABNORMAL LOW (ref 12–46)
MCH: 25.9 pg — AB (ref 26.0–34.0)
MCHC: 32.3 g/dL (ref 30.0–36.0)
MCV: 80.3 fL (ref 78.0–100.0)
MONO ABS: 0.9 10*3/uL (ref 0.1–1.0)
MONOS PCT: 7 % (ref 3–12)
Neutro Abs: 10.7 10*3/uL — ABNORMAL HIGH (ref 1.7–7.7)
Neutrophils Relative %: 87 % — ABNORMAL HIGH (ref 43–77)
Platelets: 209 10*3/uL (ref 150–400)
RBC: 4.51 MIL/uL (ref 3.87–5.11)
RDW: 13.7 % (ref 11.5–15.5)
WBC: 12.4 10*3/uL — ABNORMAL HIGH (ref 4.0–10.5)

## 2014-05-28 LAB — URINALYSIS, ROUTINE W REFLEX MICROSCOPIC
BILIRUBIN URINE: NEGATIVE
GLUCOSE, UA: NEGATIVE mg/dL
HGB URINE DIPSTICK: NEGATIVE
KETONES UR: NEGATIVE mg/dL
NITRITE: NEGATIVE
PH: 8.5 — AB (ref 5.0–8.0)
Protein, ur: NEGATIVE mg/dL
SPECIFIC GRAVITY, URINE: 1.022 (ref 1.005–1.030)
Urobilinogen, UA: 0.2 mg/dL (ref 0.0–1.0)

## 2014-05-28 LAB — POC URINE PREG, ED: PREG TEST UR: NEGATIVE

## 2014-05-28 LAB — LIPASE, BLOOD: LIPASE: 21 U/L (ref 11–59)

## 2014-05-28 LAB — I-STAT TROPONIN, ED: Troponin i, poc: 0.01 ng/mL (ref 0.00–0.08)

## 2014-05-28 LAB — URINE MICROSCOPIC-ADD ON

## 2014-05-28 SURGERY — APPENDECTOMY, LAPAROSCOPIC
Anesthesia: General | Site: Abdomen

## 2014-05-28 MED ORDER — PROPOFOL 10 MG/ML IV BOLUS
INTRAVENOUS | Status: DC | PRN
  Administered 2014-05-28: 200 mg via INTRAVENOUS

## 2014-05-28 MED ORDER — ONDANSETRON HCL 4 MG/2ML IJ SOLN
INTRAMUSCULAR | Status: DC | PRN
  Administered 2014-05-28: 4 mg via INTRAVENOUS

## 2014-05-28 MED ORDER — LACTATED RINGERS IR SOLN
Status: DC | PRN
  Administered 2014-05-28: 1000 mL

## 2014-05-28 MED ORDER — ONDANSETRON HCL 4 MG PO TABS: 4.0000 mg | ORAL_TABLET | Freq: Four times a day (QID) | ORAL | Status: DC | PRN

## 2014-05-28 MED ORDER — 0.9 % SODIUM CHLORIDE (POUR BTL) OPTIME
TOPICAL | Status: DC | PRN
  Administered 2014-05-28: 1000 mL

## 2014-05-28 MED ORDER — MORPHINE SULFATE 4 MG/ML IJ SOLN
4.0000 mg | Freq: Once | INTRAMUSCULAR | Status: AC
  Administered 2014-05-28: 4 mg via INTRAVENOUS
  Filled 2014-05-28: qty 1

## 2014-05-28 MED ORDER — HEPARIN SODIUM (PORCINE) 5000 UNIT/ML IJ SOLN
5000.0000 [IU] | Freq: Three times a day (TID) | INTRAMUSCULAR | Status: DC
  Administered 2014-05-29: 5000 [IU] via SUBCUTANEOUS
  Filled 2014-05-28 (×4): qty 1

## 2014-05-28 MED ORDER — MIDAZOLAM HCL 2 MG/2ML IJ SOLN
INTRAMUSCULAR | Status: AC
  Filled 2014-05-28: qty 2

## 2014-05-28 MED ORDER — MIDAZOLAM HCL 5 MG/5ML IJ SOLN
INTRAMUSCULAR | Status: DC | PRN
  Administered 2014-05-28: 2 mg via INTRAVENOUS

## 2014-05-28 MED ORDER — IOHEXOL 300 MG/ML  SOLN
100.0000 mL | Freq: Once | INTRAMUSCULAR | Status: AC | PRN
  Administered 2014-05-28: 100 mL via INTRAVENOUS

## 2014-05-28 MED ORDER — SUCCINYLCHOLINE CHLORIDE 20 MG/ML IJ SOLN
INTRAMUSCULAR | Status: DC | PRN
  Administered 2014-05-28: 100 mg via INTRAVENOUS

## 2014-05-28 MED ORDER — HYDROMORPHONE HCL PF 1 MG/ML IJ SOLN: 0.2500 mg | INTRAMUSCULAR | Status: DC | PRN

## 2014-05-28 MED ORDER — GLYCOPYRROLATE 0.2 MG/ML IJ SOLN
INTRAMUSCULAR | Status: AC
  Filled 2014-05-28: qty 2

## 2014-05-28 MED ORDER — PHENYLEPHRINE HCL 10 MG/ML IJ SOLN
INTRAMUSCULAR | Status: DC | PRN
  Administered 2014-05-28: 80 ug via INTRAVENOUS

## 2014-05-28 MED ORDER — DEXAMETHASONE SODIUM PHOSPHATE 10 MG/ML IJ SOLN
INTRAMUSCULAR | Status: DC | PRN
  Administered 2014-05-28: 10 mg via INTRAVENOUS

## 2014-05-28 MED ORDER — GLYCOPYRROLATE 0.2 MG/ML IJ SOLN
INTRAMUSCULAR | Status: DC | PRN
  Administered 2014-05-28: 0.4 mg via INTRAVENOUS

## 2014-05-28 MED ORDER — SODIUM CHLORIDE 0.9 % IV SOLN
INTRAVENOUS | Status: AC
  Filled 2014-05-28: qty 3

## 2014-05-28 MED ORDER — LACTATED RINGERS IV SOLN
INTRAVENOUS | Status: DC | PRN
  Administered 2014-05-28: 21:00:00 via INTRAVENOUS

## 2014-05-28 MED ORDER — BUPIVACAINE-EPINEPHRINE (PF) 0.5% -1:200000 IJ SOLN
INTRAMUSCULAR | Status: AC
  Filled 2014-05-28: qty 30

## 2014-05-28 MED ORDER — MORPHINE SULFATE 2 MG/ML IJ SOLN: 2.0000 mg | INTRAMUSCULAR | Status: DC | PRN

## 2014-05-28 MED ORDER — ONDANSETRON HCL 4 MG/2ML IJ SOLN: 4.0000 mg | Freq: Four times a day (QID) | INTRAMUSCULAR | Status: DC | PRN

## 2014-05-28 MED ORDER — SODIUM CHLORIDE 0.9 % IV SOLN
3.0000 g | Freq: Four times a day (QID) | INTRAVENOUS | Status: AC
  Administered 2014-05-29: 3 g via INTRAVENOUS
  Filled 2014-05-28: qty 3

## 2014-05-28 MED ORDER — ROCURONIUM BROMIDE 100 MG/10ML IV SOLN
INTRAVENOUS | Status: DC | PRN
  Administered 2014-05-28: 30 mg via INTRAVENOUS

## 2014-05-28 MED ORDER — ONDANSETRON HCL 4 MG/2ML IJ SOLN
4.0000 mg | Freq: Once | INTRAMUSCULAR | Status: AC
  Administered 2014-05-28: 4 mg via INTRAVENOUS
  Filled 2014-05-28: qty 2

## 2014-05-28 MED ORDER — LIDOCAINE HCL (CARDIAC) 20 MG/ML IV SOLN
INTRAVENOUS | Status: DC | PRN
  Administered 2014-05-28: 100 mg via INTRAVENOUS

## 2014-05-28 MED ORDER — NEOSTIGMINE METHYLSULFATE 10 MG/10ML IV SOLN
INTRAVENOUS | Status: DC | PRN
  Administered 2014-05-28: 3 mg via INTRAVENOUS

## 2014-05-28 MED ORDER — LACTATED RINGERS IV SOLN
INTRAVENOUS | Status: DC
Start: 2014-05-28 — End: 2014-05-28
  Administered 2014-05-28: 22:00:00 via INTRAVENOUS

## 2014-05-28 MED ORDER — FENTANYL CITRATE 0.05 MG/ML IJ SOLN
INTRAMUSCULAR | Status: AC
  Filled 2014-05-28: qty 5

## 2014-05-28 MED ORDER — DEXAMETHASONE SODIUM PHOSPHATE 10 MG/ML IJ SOLN
INTRAMUSCULAR | Status: AC
  Filled 2014-05-28: qty 1

## 2014-05-28 MED ORDER — FENTANYL CITRATE 0.05 MG/ML IJ SOLN
INTRAMUSCULAR | Status: DC | PRN
  Administered 2014-05-28 (×5): 50 ug via INTRAVENOUS

## 2014-05-28 MED ORDER — PROPOFOL 10 MG/ML IV BOLUS
INTRAVENOUS | Status: AC
  Filled 2014-05-28: qty 20

## 2014-05-28 MED ORDER — DEXTROSE IN LACTATED RINGERS 5 % IV SOLN
INTRAVENOUS | Status: DC
  Administered 2014-05-29 – 2014-05-30 (×2): via INTRAVENOUS

## 2014-05-28 MED ORDER — ONDANSETRON HCL 4 MG/2ML IJ SOLN
INTRAMUSCULAR | Status: AC
  Filled 2014-05-28: qty 2

## 2014-05-28 MED ORDER — BUPIVACAINE-EPINEPHRINE 0.5% -1:200000 IJ SOLN
INTRAMUSCULAR | Status: DC | PRN
  Administered 2014-05-28: 10 mL

## 2014-05-28 MED ORDER — SODIUM CHLORIDE 0.9 % IV SOLN
3.0000 g | Freq: Once | INTRAVENOUS | Status: AC
  Administered 2014-05-28: 3 g via INTRAVENOUS
  Filled 2014-05-28: qty 3

## 2014-05-28 MED ORDER — OXYCODONE-ACETAMINOPHEN 5-325 MG PO TABS
1.0000 | ORAL_TABLET | ORAL | Status: DC | PRN
  Administered 2014-05-29: 1 via ORAL
  Administered 2014-05-29: 2 via ORAL
  Administered 2014-05-30: 1 via ORAL
  Filled 2014-05-28 (×2): qty 1
  Filled 2014-05-28: qty 2

## 2014-05-28 SURGICAL SUPPLY — 45 items
APPLIER CLIP 5 13 M/L LIGAMAX5 (MISCELLANEOUS)
APPLIER CLIP ROT 10 11.4 M/L (STAPLE)
CANISTER SUCTION 2500CC (MISCELLANEOUS) ×3 IMPLANT
CHLORAPREP W/TINT 26ML (MISCELLANEOUS) ×3 IMPLANT
CLIP APPLIE 5 13 M/L LIGAMAX5 (MISCELLANEOUS) IMPLANT
CLIP APPLIE ROT 10 11.4 M/L (STAPLE) IMPLANT
CLOSURE WOUND 1/2 X4 (GAUZE/BANDAGES/DRESSINGS)
CUTTER FLEX LINEAR 45M (STAPLE) ×3 IMPLANT
DECANTER SPIKE VIAL GLASS SM (MISCELLANEOUS) IMPLANT
DERMABOND ADVANCED (GAUZE/BANDAGES/DRESSINGS) ×2
DERMABOND ADVANCED .7 DNX12 (GAUZE/BANDAGES/DRESSINGS) ×1 IMPLANT
DRAPE LAPAROSCOPIC ABDOMINAL (DRAPES) ×3 IMPLANT
ELECT REM PT RETURN 9FT ADLT (ELECTROSURGICAL) ×3
ELECTRODE REM PT RTRN 9FT ADLT (ELECTROSURGICAL) ×1 IMPLANT
GLOVE BIO SURGEON STRL SZ7.5 (GLOVE) ×3 IMPLANT
GLOVE BIOGEL PI IND STRL 6.5 (GLOVE) ×1 IMPLANT
GLOVE BIOGEL PI IND STRL 7.0 (GLOVE) ×1 IMPLANT
GLOVE BIOGEL PI IND STRL 8 (GLOVE) ×1 IMPLANT
GLOVE BIOGEL PI INDICATOR 6.5 (GLOVE) ×2
GLOVE BIOGEL PI INDICATOR 7.0 (GLOVE) ×2
GLOVE BIOGEL PI INDICATOR 8 (GLOVE) ×2
GLOVE SS BIOGEL STRL SZ 7.5 (GLOVE) ×1 IMPLANT
GLOVE SUPERSENSE BIOGEL SZ 7.5 (GLOVE) ×2
GLOVE SURG SS PI 6.5 STRL IVOR (GLOVE) ×9 IMPLANT
GOWN STRL REUS W/TWL LRG LVL3 (GOWN DISPOSABLE) ×3 IMPLANT
GOWN STRL REUS W/TWL XL LVL3 (GOWN DISPOSABLE) ×9 IMPLANT
IV LACTATED RINGERS 1000ML (IV SOLUTION) ×3 IMPLANT
KIT BASIN OR (CUSTOM PROCEDURE TRAY) ×3 IMPLANT
NS IRRIG 1000ML POUR BTL (IV SOLUTION) ×3 IMPLANT
PENCIL BUTTON HOLSTER BLD 10FT (ELECTRODE) IMPLANT
POUCH SPECIMEN RETRIEVAL 10MM (ENDOMECHANICALS) ×3 IMPLANT
RELOAD 45 VASCULAR/THIN (ENDOMECHANICALS) IMPLANT
RELOAD STAPLE TA45 3.5 REG BLU (ENDOMECHANICALS) ×3 IMPLANT
SET IRRIG TUBING LAPAROSCOPIC (IRRIGATION / IRRIGATOR) ×3 IMPLANT
SHEARS HARMONIC ACE PLUS 36CM (ENDOMECHANICALS) ×3 IMPLANT
SOLUTION ANTI FOG 6CC (MISCELLANEOUS) ×3 IMPLANT
STRIP CLOSURE SKIN 1/2X4 (GAUZE/BANDAGES/DRESSINGS) IMPLANT
SUT MNCRL AB 4-0 PS2 18 (SUTURE) ×3 IMPLANT
TOWEL OR 17X26 10 PK STRL BLUE (TOWEL DISPOSABLE) ×3 IMPLANT
TRAY FOLEY CATH 14FRSI W/METER (CATHETERS) ×3 IMPLANT
TRAY LAP CHOLE (CUSTOM PROCEDURE TRAY) ×3 IMPLANT
TROCAR BLADELESS OPT 5 75 (ENDOMECHANICALS) ×3 IMPLANT
TROCAR XCEL 12X100 BLDLESS (ENDOMECHANICALS) ×3 IMPLANT
TROCAR XCEL BLUNT TIP 100MML (ENDOMECHANICALS) ×3 IMPLANT
TUBING INSUFFLATION 10FT LAP (TUBING) ×3 IMPLANT

## 2014-05-28 NOTE — Transfer of Care (Signed)
Immediate Anesthesia Transfer of Care Note  Patient: Sabrina [name]  Procedure(s) Performed: Procedure(s) (LRB): APPENDECTOMY LAPAROSCOPIC (N/A)  Patient Location: PACU  Anesthesia Type: General  Level of Consciousness: sedated, patient cooperative and responds to stimulation  Airway & Oxygen Therapy: Patient Spontanous Breathing and Patient connected to face mask oxgen  Post-op Assessment: Report given to PACU RN and Post -op Vital signs reviewed and stable  Post vital signs: Reviewed and stable  Complications: No apparent anesthesia complications

## 2014-05-28 NOTE — ED Provider Notes (Signed)
CSN: [id number]     Arrival date & time 05/28/14  61 History   First MD Initiated Contact with Patient 05/28/14 1611     Chief Complaint  Patient presents with  . Abdominal Pain      HPI  Patient presents with abdominal pain for 2 days. Started mainly epigastric yesterday. Most her abdomen diffusely and primarily right lower quadrant tenderness and pain. Coughing ambulate. Nausea and vomiting. No fever. Denies pregnancy. No urinary symptoms.  Past Medical History  Diagnosis Date  . No pertinent past medical history   . Hx of gestational diabetes in prior pregnancy, currently pregnant    Past Surgical History  Procedure Laterality Date  . No past surgeries     Family History  Problem Relation Age of Onset  . Hypertension Mother   . Hypertension Father   . Heart attack Father   . Hypertension Sister   . Depression Maternal Grandmother   . Depression Maternal Grandfather   . Depression Paternal Grandmother   . Depression Paternal Grandfather    History  Substance Use Topics  . Smoking status: Never Smoker   . Smokeless tobacco: Never Used  . Alcohol Use: No   OB History   Grav Para Term Preterm Abortions TAB SAB Ect Mult Living   5 4 4  0 1 0 1 0 0 4     Review of Systems  Constitutional: Negative for fever, chills, diaphoresis, appetite change and fatigue.  HENT: Negative for mouth sores, sore throat and trouble swallowing.   Eyes: Negative for visual disturbance.  Respiratory: Negative for cough, chest tightness, shortness of breath and wheezing.   Cardiovascular: Negative for chest pain.  Gastrointestinal: Positive for nausea, vomiting and abdominal pain. Negative for diarrhea and abdominal distention.  Endocrine: Negative for polydipsia, polyphagia and polyuria.  Genitourinary: Negative for dysuria, frequency and hematuria.  Musculoskeletal: Negative for gait problem.  Skin: Negative for color change, pallor and rash.  Neurological: Negative for dizziness,  syncope, light-headedness and headaches.  Hematological: Does not bruise/bleed easily.  Psychiatric/Behavioral: Negative for behavioral problems and confusion.      Allergies  Review of patient's allergies indicates no known allergies.  Home Medications   Prior to Admission medications   Not on File   BP 102/79  Pulse 99  Temp(Src) 97.8 F (36.6 C) (Oral)  Resp 18  SpO2 100%  LMP 05/19/2014 Physical Exam  Constitutional: She is oriented to person, place, and time. She appears well-developed and well-nourished. No distress.  HENT:  Head: Normocephalic.  Eyes: Conjunctivae are normal. Pupils are equal, round, and reactive to light. No scleral icterus.  Neck: Normal range of motion. Neck supple. No thyromegaly present.  Cardiovascular: Normal rate and regular rhythm.  Exam reveals no gallop and no friction rub.   No murmur heard. Pulmonary/Chest: Effort normal and breath sounds normal. No respiratory distress. She has no wheezes. She has no rales.  Abdominal: Soft. Bowel sounds are normal. She exhibits no distension. There is no tenderness. There is no rebound.    Musculoskeletal: Normal range of motion.  Neurological: She is alert and oriented to person, place, and time.  Skin: Skin is warm and dry. No rash noted.  Psychiatric: She has a normal mood and affect. Her behavior is normal.    ED Course  Procedures (including critical care time) Labs Review Labs Reviewed  CBC WITH DIFFERENTIAL - Abnormal; Notable for the following:    WBC 12.4 (*)    Hemoglobin 11.7 (*)  MCH 25.9 (*)    Neutrophils Relative % 87 (*)    Neutro Abs 10.7 (*)    Lymphocytes Relative 6 (*)    All other components within normal limits  COMPREHENSIVE METABOLIC PANEL - Abnormal; Notable for the following:    Glucose, Bld 115 (*)    All other components within normal limits  URINALYSIS, ROUTINE W REFLEX MICROSCOPIC - Abnormal; Notable for the following:    Color, Urine AMBER (*)    pH 8.5  (*)    Leukocytes, UA TRACE (*)    All other components within normal limits  URINE MICROSCOPIC-ADD ON - Abnormal; Notable for the following:    Bacteria, UA FEW (*)    All other components within normal limits  LIPASE, BLOOD  I-STAT TROPOININ, ED  POC URINE PREG, ED    Imaging Review Ct Abdomen Pelvis W Contrast  05/28/2014   CLINICAL DATA:  Generalized abdominal pain and body aches for 2 days.  EXAM: CT ABDOMEN AND PELVIS WITH CONTRAST  TECHNIQUE: Multidetector CT imaging of the abdomen and pelvis was performed using the standard protocol following bolus administration of intravenous contrast.  CONTRAST:  143mL OMNIPAQUE IOHEXOL 300 MG/ML  SOLN  COMPARISON:  Ultrasound pelvis 07/13/2011  FINDINGS: Visualization of lower thorax demonstrates no consolidative or nodular pulmonary opacities. Normal heart size.  The liver, gallbladder, spleen, pancreas and bilateral adrenal glands are unremarkable. Kidneys enhance symmetrically with contrast. No hydronephrosis.  Normal caliber abdominal aorta. No retroperitoneal lymphadenopathy. Multiple collateral vessels demonstrated within the pelvis. Prominence of the endometrium. Urinary bladder is unremarkable.  The appendix is dilated measuring up to 9 mm with surrounding periappendiceal fat stranding. There are multiple appendicoliths within the base of the appendix. No evidence for bowel obstruction. No free intraperitoneal air or fluid.  No aggressive or acute appearing osseous lesions.  IMPRESSION: Findings compatible with acute appendicitis. No evidence for perforation or periappendiceal abscess formation.  Prominence of the endometrium, recommend correlation with pelvic ultrasound.  Critical Value/emergent results were called by telephone at the time of interpretation on 05/28/2014 at 7:21 pm to Dr. Tanna Furry , who verbally acknowledged these results.   Electronically Signed   By: Lovey Newcomer M.D.   On: 05/28/2014 19:23     EKG Interpretation None       MDM   Final diagnoses:  Acute appendicitis with localized peritonitis    CT scan shows a complicated acute appendicitis. Care discussed with general surgery. They're in route. Patient be kept n.p.o. I discussed the CT findings and plan with her.    Tanna Furry, MD 05/28/14 2126

## 2014-05-28 NOTE — Anesthesia Preprocedure Evaluation (Signed)
Anesthesia Evaluation  Patient identified by MRN, date of birth, ID band Patient awake    Reviewed: Allergy & Precautions, H&P , NPO status , Patient's Chart, lab work & pertinent test results  Airway Mallampati: II TM Distance: >3 FB Neck ROM: full    Dental no notable dental hx. (+) Teeth Intact, Dental Advisory Given   Pulmonary neg pulmonary ROS,  breath sounds clear to auscultation  Pulmonary exam normal       Cardiovascular Exercise Tolerance: Good negative cardio ROS  + Cardiac Defibrillator Rhythm:regular Rate:Normal     Neuro/Psych negative neurological ROS  negative psych ROS   GI/Hepatic negative GI ROS, Neg liver ROS,   Endo/Other  negative endocrine ROS  Renal/GU negative Renal ROS  negative genitourinary   Musculoskeletal   Abdominal   Peds  Hematology negative hematology ROS (+)   Anesthesia Other Findings   Reproductive/Obstetrics negative OB ROS                           Anesthesia Physical Anesthesia Plan  ASA: I and emergent  Anesthesia Plan: General   Post-op Pain Management:    Induction: Intravenous, Rapid sequence and Cricoid pressure planned  Airway Management Planned: Oral ETT  Additional Equipment:   Intra-op Plan:   Post-operative Plan: Extubation in OR  Informed Consent: I have reviewed the patients History and Physical, chart, labs and discussed the procedure including the risks, benefits and alternatives for the proposed anesthesia with the patient or authorized representative who has indicated his/her understanding and acceptance.   Dental Advisory Given  Plan Discussed with: CRNA and Surgeon  Anesthesia Plan Comments:         Anesthesia Quick Evaluation

## 2014-05-28 NOTE — ED Notes (Signed)
Pt states that she has had generalized abdominal pain and body aches x 2 days. Alert and oriented.

## 2014-05-28 NOTE — Anesthesia Postprocedure Evaluation (Signed)
  Anesthesia Post-op Note  Patient: Sabrina [name]  Procedure(s) Performed: Procedure(s) (LRB): APPENDECTOMY LAPAROSCOPIC (N/A)  Patient Location: PACU  Anesthesia Type: General  Level of Consciousness: awake and alert   Airway and Oxygen Therapy: Patient Spontanous Breathing  Post-op Pain: mild  Post-op Assessment: Post-op Vital signs reviewed, Patient's Cardiovascular Status Stable, Respiratory Function Stable, Patent Airway and No signs of Nausea or vomiting  Last Vitals:  Filed Vitals:   05/28/14 2219  BP:   Pulse:   Temp: 36.4 C  Resp:     Post-op Vital Signs: stable   Complications: No apparent anesthesia complications

## 2014-05-28 NOTE — H&P (Signed)
Sabrina [name] is an 38 y.o. female.    Chief Complaint: Abdominal pain  HPI: Patient is a generally healthy 38 year old female who about 4 days ago had the gradual onset of initially vague abdominal pain. She describes pain which is difficult to characterize throughout her abdomen which initially was not severe. She did have a lack of appetite. Since about 24 hours ago the pain has gradually become more intense and constant. It is mainly across her lower abdomen. Worse with any motion. She has been nauseated and vomited several times in the last 24 hours. She has malaise and general weakness. Normal bowel movement yesterday. Some urgency to urination but no burning or blood. She does not have any chronic GI complaints.  Past Medical History  Diagnosis Date  . No pertinent past medical history   . Hx of gestational diabetes in prior pregnancy, currently pregnant     Past Surgical History  Procedure Laterality Date  . No past surgeries      Family History  Problem Relation Age of Onset  . Hypertension Mother   . Hypertension Father   . Heart attack Father   . Hypertension Sister   . Depression Maternal Grandmother   . Depression Maternal Grandfather   . Depression Paternal Grandmother   . Depression Paternal Grandfather    Social History:  reports that she has never smoked. She has never used smokeless tobacco. She reports that she does not drink alcohol or use illicit drugs.  Allergies: No Known Allergies   Current Facility-Administered Medications  Medication Dose Route Frequency Provider Last Rate Last Dose  . Ampicillin-Sulbactam (UNASYN) 3 g in sodium chloride 0.9 % 100 mL IVPB  3 g Intravenous Once Edward Jolly, MD       No current outpatient prescriptions on file.     Results for orders placed during the hospital encounter of 05/28/14 (from the past 48 hour(s))  CBC WITH DIFFERENTIAL     Status: Abnormal   Collection Time    05/28/14  3:42 PM      Result Value  Ref Range   WBC 12.4 (*) 4.0 - 10.5 K/uL   RBC 4.51  3.87 - 5.11 MIL/uL   Hemoglobin 11.7 (*) 12.0 - 15.0 g/dL   HCT 36.2  36.0 - 46.0 %   MCV 80.3  78.0 - 100.0 fL   MCH 25.9 (*) 26.0 - 34.0 pg   MCHC 32.3  30.0 - 36.0 g/dL   RDW 13.7  11.5 - 15.5 %   Platelets 209  150 - 400 K/uL   Neutrophils Relative % 87 (*) 43 - 77 %   Neutro Abs 10.7 (*) 1.7 - 7.7 K/uL   Lymphocytes Relative 6 (*) 12 - 46 %   Lymphs Abs 0.8  0.7 - 4.0 K/uL   Monocytes Relative 7  3 - 12 %   Monocytes Absolute 0.9  0.1 - 1.0 K/uL   Eosinophils Relative 0  0 - 5 %   Eosinophils Absolute 0.0  0.0 - 0.7 K/uL   Basophils Relative 0  0 - 1 %   Basophils Absolute 0.0  0.0 - 0.1 K/uL  COMPREHENSIVE METABOLIC PANEL     Status: Abnormal   Collection Time    05/28/14  3:42 PM      Result Value Ref Range   Sodium 137  137 - 147 mEq/L   Potassium 3.8  3.7 - 5.3 mEq/L   Chloride 99  96 - 112 mEq/L  CO2 24  19 - 32 mEq/L   Glucose, Bld 115 (*) 70 - 99 mg/dL   BUN 6  6 - 23 mg/dL   Creatinine, Ser 0.61  0.50 - 1.10 mg/dL   Calcium 9.9  8.4 - 10.5 mg/dL   Total Protein 7.9  6.0 - 8.3 g/dL   Albumin 4.0  3.5 - 5.2 g/dL   AST 16  0 - 37 U/L   ALT 12  0 - 35 U/L   Alkaline Phosphatase 64  39 - 117 U/L   Total Bilirubin 0.6  0.3 - 1.2 mg/dL   GFR calc non Af Amer >90  >90 mL/min   GFR calc Af Amer >90  >90 mL/min   Comment: (NOTE)     The eGFR has been calculated using the CKD EPI equation.     This calculation has not been validated in all clinical situations.     eGFR's persistently <90 mL/min signify possible Chronic Kidney     Disease.   Anion gap 14  5 - 15  LIPASE, BLOOD     Status: None   Collection Time    05/28/14  3:42 PM      Result Value Ref Range   Lipase 21  11 - 59 U/L  I-STAT TROPOININ, ED     Status: None   Collection Time    05/28/14  3:59 PM      Result Value Ref Range   Troponin i, poc 0.01  0.00 - 0.08 ng/mL   Comment 3            Comment: Due to the release kinetics of cTnI,     a  negative result within the first hours     of the onset of symptoms does not rule out     myocardial infarction with certainty.     If myocardial infarction is still suspected,     repeat the test at appropriate intervals.  URINALYSIS, ROUTINE W REFLEX MICROSCOPIC     Status: Abnormal   Collection Time    05/28/14  4:20 PM      Result Value Ref Range   Color, Urine AMBER (*) YELLOW   Comment: BIOCHEMICALS MAY BE AFFECTED BY COLOR   APPearance CLEAR  CLEAR   Specific Gravity, Urine 1.022  1.005 - 1.030   pH 8.5 (*) 5.0 - 8.0   Glucose, UA NEGATIVE  NEGATIVE mg/dL   Hgb urine dipstick NEGATIVE  NEGATIVE   Bilirubin Urine NEGATIVE  NEGATIVE   Ketones, ur NEGATIVE  NEGATIVE mg/dL   Protein, ur NEGATIVE  NEGATIVE mg/dL   Urobilinogen, UA 0.2  0.0 - 1.0 mg/dL   Nitrite NEGATIVE  NEGATIVE   Leukocytes, UA TRACE (*) NEGATIVE  URINE MICROSCOPIC-ADD ON     Status: Abnormal   Collection Time    05/28/14  4:20 PM      Result Value Ref Range   Squamous Epithelial / LPF RARE  RARE   WBC, UA 0-2  <3 WBC/hpf   Bacteria, UA FEW (*) RARE   Urine-Other MUCOUS PRESENT    POC URINE PREG, ED     Status: None   Collection Time    05/28/14  4:27 PM      Result Value Ref Range   Preg Test, Ur NEGATIVE  NEGATIVE   Comment:            THE SENSITIVITY OF THIS     METHODOLOGY IS >24 mIU/mL   Ct Abdomen  Pelvis W Contrast  05/28/2014   CLINICAL DATA:  Generalized abdominal pain and body aches for 2 days.  EXAM: CT ABDOMEN AND PELVIS WITH CONTRAST  TECHNIQUE: Multidetector CT imaging of the abdomen and pelvis was performed using the standard protocol following bolus administration of intravenous contrast.  CONTRAST:  167m OMNIPAQUE IOHEXOL 300 MG/ML  SOLN  COMPARISON:  Ultrasound pelvis 07/13/2011  FINDINGS: Visualization of lower thorax demonstrates no consolidative or nodular pulmonary opacities. Normal heart size.  The liver, gallbladder, spleen, pancreas and bilateral adrenal glands are unremarkable.  Kidneys enhance symmetrically with contrast. No hydronephrosis.  Normal caliber abdominal aorta. No retroperitoneal lymphadenopathy. Multiple collateral vessels demonstrated within the pelvis. Prominence of the endometrium. Urinary bladder is unremarkable.  The appendix is dilated measuring up to 9 mm with surrounding periappendiceal fat stranding. There are multiple appendicoliths within the base of the appendix. No evidence for bowel obstruction. No free intraperitoneal air or fluid.  No aggressive or acute appearing osseous lesions.  IMPRESSION: Findings compatible with acute appendicitis. No evidence for perforation or periappendiceal abscess formation.  Prominence of the endometrium, recommend correlation with pelvic ultrasound.  Critical Value/emergent results were called by telephone at the time of interpretation on 05/28/2014 at 7:21 pm to Dr. MTanna Furry, who verbally acknowledged these results.   Electronically Signed   By: DLovey NewcomerM.D.   On: 05/28/2014 19:23    Review of Systems  Constitutional: Positive for malaise/fatigue. Negative for fever and chills.  Respiratory: Negative.   Cardiovascular: Negative.   Gastrointestinal: Positive for nausea, vomiting and abdominal pain. Negative for diarrhea, constipation and blood in stool.  Genitourinary: Positive for urgency. Negative for dysuria, frequency and hematuria.  Musculoskeletal: Negative.   Neurological: Positive for dizziness and weakness.    Blood pressure 102/79, pulse 99, temperature 97.8 F (36.6 C), temperature source Oral, resp. rate 18, last menstrual period 05/19/2014, SpO2 100.00%, unknown if currently breastfeeding. Physical Exam  General: Alert, well-developed Female, in no distress Skin: Warm and dry without rash or infection. HEENT: No palpable masses or thyromegaly. Sclera nonicteric. Pupils equal round and reactive.  Lymph nodes: No cervical, supraclavicular, or inguinal nodes palpable. Lungs: Breath sounds clear  and equal without increased work of breathing Cardiovascular: Regular rate and rhythm without murmur. No JVD or edema. Peripheral pulses intact. Abdomen: Nondistended. Mild diffuse tenderness and very significant localized right lower quadrant tenderness with guarding. No masses palpable. No organomegaly. No palpable hernias. Extremities: No edema or joint swelling or deformity. No chronic venous stasis changes. Neurologic: Alert and fully oriented. Gait normal.  Assessment/Plan Symptoms and physical findings consistent with acute appendicitis. CT scan shows evidence of significant appendicitis with appendicolith but no apparent perforation. I recommended proceeding with emergency laparoscopic appendectomy. I discussed the indications in nature the surgery with the patient and her husband as well as potential risks of anesthetic complications, bleeding, infection or need for open procedure. She expresses understanding and agrees to proceed.  Valrie Jia T 05/28/2014, 8:21 PM

## 2014-05-28 NOTE — Op Note (Signed)
Preoperative Diagnosis: Acute appendicitis with localized peritonitis [540.1]  Postoprative Diagnosis: Acute appendicitis with localized peritonitis [540.1]  Procedure: Procedure(s): APPENDECTOMY LAPAROSCOPIC   Surgeon: Excell Seltzer T   Assistants: None  Anesthesia:  General endotracheal anesthesia  Indications: Patient is a 38 year old female who presents with symptoms and physical findings consistent with acute appendicitis. CT scan has shown evidence of acute appendicitis with several appendicoliths. I recommended proceeding with laparoscopic appendectomy. The procedure and indications and risks have been discussed in detail all swearing she and her husband are in agreement.  Procedure Detail: Patient was brought to the operating room, placed in supine position on the operating table, and general endotracheal anesthesia induced. She received preoperative IV antibiotics. PAS were in place. The abdomen was widely sterilely prepped and draped. Foley catheter was placed. Patient timeout was performed and correct procedure verified. Access was obtained a 1 cm incision at the umbilicus with an open Hassan need for a mattress suture of 0 Vicryl and pneumoperitoneum established. A 5 mm trocar is placed in the right upper quadrant and a 12 mm trocar in the left lower quadrant under direct vision. The appendix was exposed lying immediately beneath the cecum and was acutely inflamed with exudate at its distal one half with the proximal appendix being uninflamed. The appendix was quite long. The appendix was mobilized dividing lateral peritoneal attachments. The mesial appendix was then sequentially divided with the harmonic scalpel until it was completely freed down to the tip of the cecum. The appendix was divided across the tip of the cecum with a single firing of the GIA 45 mm blue load stapler. The appendix was placed in an Endo Catch and brought out through the umbilicus. The right lower quadrant  was thoroughly irrigated until clear and hemostasis assured. No evidence of trocar injury. All CO2 was evacuated trochars removed. A mattress suture was secured at the umbilicus. Skin incisions were closed with subcuticular Monocryl and Dermabond. Sponge needle and instrument counts were correct.    Findings: Acute suppurative appendicitis without gangrene or perforation  Estimated Blood Loss:  Minimal         Drains: none  Blood Given: none          Specimens: Appendix        Complications:  * No complications entered in OR log *         Disposition: PACU - hemodynamically stable.         Condition: stable

## 2014-05-29 ENCOUNTER — Encounter (HOSPITAL_COMMUNITY): Payer: Self-pay | Admitting: General Surgery

## 2014-05-29 DIAGNOSIS — R109 Unspecified abdominal pain: Secondary | ICD-10-CM | POA: Diagnosis present

## 2014-05-29 DIAGNOSIS — Z8249 Family history of ischemic heart disease and other diseases of the circulatory system: Secondary | ICD-10-CM | POA: Diagnosis not present

## 2014-05-29 DIAGNOSIS — K3580 Unspecified acute appendicitis: Secondary | ICD-10-CM | POA: Diagnosis not present

## 2014-05-29 DIAGNOSIS — K659 Peritonitis, unspecified: Secondary | ICD-10-CM | POA: Diagnosis present

## 2014-05-29 DIAGNOSIS — R51 Headache: Secondary | ICD-10-CM | POA: Diagnosis present

## 2014-05-29 LAB — CBC
HEMATOCRIT: 34.8 % — AB (ref 36.0–46.0)
Hemoglobin: 11.1 g/dL — ABNORMAL LOW (ref 12.0–15.0)
MCH: 25.9 pg — ABNORMAL LOW (ref 26.0–34.0)
MCHC: 31.9 g/dL (ref 30.0–36.0)
MCV: 81.3 fL (ref 78.0–100.0)
Platelets: 175 10*3/uL (ref 150–400)
RBC: 4.28 MIL/uL (ref 3.87–5.11)
RDW: 13.9 % (ref 11.5–15.5)
WBC: 11.6 10*3/uL — ABNORMAL HIGH (ref 4.0–10.5)

## 2014-05-29 MED ORDER — DOCUSATE SODIUM 100 MG PO CAPS
100.0000 mg | ORAL_CAPSULE | Freq: Two times a day (BID) | ORAL | Status: DC
  Administered 2014-05-29 – 2014-05-30 (×3): 100 mg via ORAL
  Filled 2014-05-29 (×4): qty 1

## 2014-05-29 MED ORDER — IBUPROFEN 600 MG PO TABS
600.0000 mg | ORAL_TABLET | Freq: Three times a day (TID) | ORAL | Status: DC
  Administered 2014-05-29 – 2014-05-30 (×4): 600 mg via ORAL
  Filled 2014-05-29 (×5): qty 1
  Filled 2014-05-29 (×2): qty 3
  Filled 2014-05-29: qty 1

## 2014-05-29 MED ORDER — ACETAMINOPHEN 325 MG PO TABS: 650.0000 mg | ORAL_TABLET | Freq: Four times a day (QID) | ORAL | Status: DC | PRN

## 2014-05-29 NOTE — Progress Notes (Signed)
Patient ID: Sabrina [name], female   DOB: 05-Aug-1976, 38 y.o.   MRN: [id number]     [address] [place], [place] [address]    Phone: [phone] FAX: [phone]     Subjective: C/o dizziness, shoulder pain and a headache.  Abdominal pain is mild. No flatus. No n/v.   Up to bathroom.  Febrile 2200 last night 101, no further fevers.   Objective:  Vital signs:  Filed Vitals:   05/29/14 0010 05/29/14 0110 05/29/14 0210 05/29/14 0630  BP: 109/65 108/65 112/68 95/55  Pulse: 74 86 74 81  Temp: 97.4 F (36.3 C) 97.9 F (36.6 C) 98.3 F (36.8 C) 98.1 F (36.7 C)  TempSrc: Oral Oral Oral Oral  Resp: 16 16 16 16   Height:      Weight:    148 lb 8 oz (67.359 kg)  SpO2: 99% 99% 100% 97%    Last BM Date: 05/28/14  Intake/Output   Yesterday:  08/04 0701 - 08/05 0700 In: 1511.3 [I.V.:1511.3] Out: 750 [Urine:750] This shift:    I/O last 3 completed shifts: In: 1511.3 [I.V.:1511.3] Out: 750 [Urine:750]    Physical Exam: General: Pt awake/alert/oriented x4 in noacute distress Chest: cta.  No chest wall pain w good excursion CV:  Pulses intact.  Regular rhythm MS: Normal AROM mjr joints.  No obvious deformity Abdomen: Soft.  Nondistended.   Mildly tender at incisions only.  Incisions are c/d/i. No evidence of peritonitis.  No incarcerated hernias. Ext:  SCDs BLE.  No mjr edema.  No cyanosis Skin: No petechiae / purpura   Problem List:   Active Problems:   Appendicitis, acute    Results:   Labs: Results for orders placed during the hospital encounter of 05/28/14 (from the past 48 hour(s))  CBC WITH DIFFERENTIAL     Status: Abnormal   Collection Time    05/28/14  3:42 PM      Result Value Ref Range   WBC 12.4 (*) 4.0 - 10.5 K/uL   RBC 4.51  3.87 - 5.11 MIL/uL   Hemoglobin 11.7 (*) 12.0 - 15.0 g/dL   HCT 36.2  36.0 - 46.0 %   MCV 80.3  78.0 - 100.0 fL   MCH 25.9 (*) 26.0 - 34.0 pg   MCHC 32.3  30.0 -  36.0 g/dL   RDW 13.7  11.5 - 15.5 %   Platelets 209  150 - 400 K/uL   Neutrophils Relative % 87 (*) 43 - 77 %   Neutro Abs 10.7 (*) 1.7 - 7.7 K/uL   Lymphocytes Relative 6 (*) 12 - 46 %   Lymphs Abs 0.8  0.7 - 4.0 K/uL   Monocytes Relative 7  3 - 12 %   Monocytes Absolute 0.9  0.1 - 1.0 K/uL   Eosinophils Relative 0  0 - 5 %   Eosinophils Absolute 0.0  0.0 - 0.7 K/uL   Basophils Relative 0  0 - 1 %   Basophils Absolute 0.0  0.0 - 0.1 K/uL  COMPREHENSIVE METABOLIC PANEL     Status: Abnormal   Collection Time    05/28/14  3:42 PM      Result Value Ref Range   Sodium 137  137 - 147 mEq/L   Potassium 3.8  3.7 - 5.3 mEq/L   Chloride 99  96 - 112 mEq/L   CO2 24  19 - 32 mEq/L   Glucose, Bld 115 (*)  70 - 99 mg/dL   BUN 6  6 - 23 mg/dL   Creatinine, Ser 0.61  0.50 - 1.10 mg/dL   Calcium 9.9  8.4 - 10.5 mg/dL   Total Protein 7.9  6.0 - 8.3 g/dL   Albumin 4.0  3.5 - 5.2 g/dL   AST 16  0 - 37 U/L   ALT 12  0 - 35 U/L   Alkaline Phosphatase 64  39 - 117 U/L   Total Bilirubin 0.6  0.3 - 1.2 mg/dL   GFR calc non Af Amer >90  >90 mL/min   GFR calc Af Amer >90  >90 mL/min   Comment: (NOTE)     The eGFR has been calculated using the CKD EPI equation.     This calculation has not been validated in all clinical situations.     eGFR's persistently <90 mL/min signify possible Chronic Kidney     Disease.   Anion gap 14  5 - 15  LIPASE, BLOOD     Status: None   Collection Time    05/28/14  3:42 PM      Result Value Ref Range   Lipase 21  11 - 59 U/L  I-STAT TROPOININ, ED     Status: None   Collection Time    05/28/14  3:59 PM      Result Value Ref Range   Troponin i, poc 0.01  0.00 - 0.08 ng/mL   Comment 3            Comment: Due to the release kinetics of cTnI,     a negative result within the first hours     of the onset of symptoms does not rule out     myocardial infarction with certainty.     If myocardial infarction is still suspected,     repeat the test at appropriate  intervals.  URINALYSIS, ROUTINE W REFLEX MICROSCOPIC     Status: Abnormal   Collection Time    05/28/14  4:20 PM      Result Value Ref Range   Color, Urine AMBER (*) YELLOW   Comment: BIOCHEMICALS MAY BE AFFECTED BY COLOR   APPearance CLEAR  CLEAR   Specific Gravity, Urine 1.022  1.005 - 1.030   pH 8.5 (*) 5.0 - 8.0   Glucose, UA NEGATIVE  NEGATIVE mg/dL   Hgb urine dipstick NEGATIVE  NEGATIVE   Bilirubin Urine NEGATIVE  NEGATIVE   Ketones, ur NEGATIVE  NEGATIVE mg/dL   Protein, ur NEGATIVE  NEGATIVE mg/dL   Urobilinogen, UA 0.2  0.0 - 1.0 mg/dL   Nitrite NEGATIVE  NEGATIVE   Leukocytes, UA TRACE (*) NEGATIVE  URINE MICROSCOPIC-ADD ON     Status: Abnormal   Collection Time    05/28/14  4:20 PM      Result Value Ref Range   Squamous Epithelial / LPF RARE  RARE   WBC, UA 0-2  <3 WBC/hpf   Bacteria, UA FEW (*) RARE   Urine-Other MUCOUS PRESENT    POC URINE PREG, ED     Status: None   Collection Time    05/28/14  4:27 PM      Result Value Ref Range   Preg Test, Ur NEGATIVE  NEGATIVE   Comment:            THE SENSITIVITY OF THIS     METHODOLOGY IS >24 mIU/mL  CBC     Status: Abnormal   Collection Time    05/29/14  4:38 AM      Result Value Ref Range   WBC 11.6 (*) 4.0 - 10.5 K/uL   RBC 4.28  3.87 - 5.11 MIL/uL   Hemoglobin 11.1 (*) 12.0 - 15.0 g/dL   HCT 34.8 (*) 36.0 - 46.0 %   MCV 81.3  78.0 - 100.0 fL   MCH 25.9 (*) 26.0 - 34.0 pg   MCHC 31.9  30.0 - 36.0 g/dL   RDW 13.9  11.5 - 15.5 %   Platelets 175  150 - 400 K/uL    Imaging / Studies: Ct Abdomen Pelvis W Contrast  05/28/2014   CLINICAL DATA:  Generalized abdominal pain and body aches for 2 days.  EXAM: CT ABDOMEN AND PELVIS WITH CONTRAST  TECHNIQUE: Multidetector CT imaging of the abdomen and pelvis was performed using the standard protocol following bolus administration of intravenous contrast.  CONTRAST:  142m OMNIPAQUE IOHEXOL 300 MG/ML  SOLN  COMPARISON:  Ultrasound pelvis 07/13/2011  FINDINGS: Visualization  of lower thorax demonstrates no consolidative or nodular pulmonary opacities. Normal heart size.  The liver, gallbladder, spleen, pancreas and bilateral adrenal glands are unremarkable. Kidneys enhance symmetrically with contrast. No hydronephrosis.  Normal caliber abdominal aorta. No retroperitoneal lymphadenopathy. Multiple collateral vessels demonstrated within the pelvis. Prominence of the endometrium. Urinary bladder is unremarkable.  The appendix is dilated measuring up to 9 mm with surrounding periappendiceal fat stranding. There are multiple appendicoliths within the base of the appendix. No evidence for bowel obstruction. No free intraperitoneal air or fluid.  No aggressive or acute appearing osseous lesions.  IMPRESSION: Findings compatible with acute appendicitis. No evidence for perforation or periappendiceal abscess formation.  Prominence of the endometrium, recommend correlation with pelvic ultrasound.  Critical Value/emergent results were called by telephone at the time of interpretation on 05/28/2014 at 7:21 pm to Dr. MTanna Furry, who verbally acknowledged these results.   Electronically Signed   By: DLovey NewcomerM.D.   On: 05/28/2014 19:23    Medications / Allergies:  Scheduled Meds: . heparin subcutaneous  5,000 Units Subcutaneous 3 times per day  . ibuprofen  600 mg Oral TID   Continuous Infusions: . dextrose 5% lactated ringers 75 mL/hr at 05/29/14 0006   PRN Meds:.acetaminophen, morphine injection, ondansetron (ZOFRAN) IV, ondansetron, oxyCODONE-acetaminophen  Antibiotics: Anti-infectives   Start     Dose/Rate Route Frequency Ordered Stop   05/29/14 0600  Ampicillin-Sulbactam (UNASYN) 3 g in sodium chloride 0.9 % 100 mL IVPB     3 g 100 mL/hr over 60 Minutes Intravenous Every 6 hours 05/28/14 2350 05/29/14 0635   05/28/14 2100  [MAR Hold]  Ampicillin-Sulbactam (UNASYN) 3 g in sodium chloride 0.9 % 100 mL IVPB     (On MAR Hold since 05/28/14 2105)   3 g 100 mL/hr over 60 Minutes  Intravenous  Once 05/28/14 2018 05/28/14 2111      Assessment/Plan Acute appendicitis with localized peritonitis  POD#1 laparoscopic appendectomy---Dr. B Hoxworth -add ibuprofen for pain, may help alleviate headache and prevent rebound headaches from narcotics -continue with IVF until oral intake improves -SCD/heparin -needs to mobilize -IS -monitor for fevers -does not need atbx per Dr. HExcell Seltzer-not quite ready to go home with above complaints, anticipate discharge in AM   [facility] AHigh[facility] CenterSurgery Pager [phone] [phone] 05/29/2014 9:25 AM

## 2014-05-29 NOTE — Progress Notes (Signed)
Pt declined heparin sq for religious reasons, after being informed that it is a porcine derivative.

## 2014-05-29 NOTE — Care Management Note (Signed)
    Page 1 of 1   05/29/2014     10:32:15 AM CARE MANAGEMENT NOTE 05/29/2014  Patient:  Sabrina [name], Sabrina [name]   Account Number:  [id number]  Date Initiated:  05/29/2014  Documentation initiated by:  Sunday Spillers  Subjective/Objective Assessment:   38 yo female admitted s/p lap appy with peritonitis. PTA lived at home with spouse.     Action/Plan:   Home when stable   Anticipated DC Date:  05/30/2014   Anticipated DC Plan:  [place]  CM consult      Choice offered to / List presented to:             Status of service:  Completed, signed off Medicare Important Message given?   (If response is "NO", the following Medicare IM given date fields will be blank) Date Medicare IM given:   Medicare IM given by:   Date Additional Medicare IM given:   Additional Medicare IM given by:    Discharge Disposition:  HOME/SELF CARE  Per UR Regulation:  Reviewed for med. necessity/level of care/duration of stay  If discussed at [place] of Stay Meetings, dates discussed:    Comments:

## 2014-05-30 MED ORDER — OXYCODONE-ACETAMINOPHEN 5-325 MG PO TABS: 1.0000 | ORAL_TABLET | ORAL | Status: DC | PRN

## 2014-05-30 MED ORDER — ACETAMINOPHEN 325 MG PO TABS: 650.0000 mg | ORAL_TABLET | Freq: Four times a day (QID) | ORAL | Status: DC | PRN

## 2014-05-30 MED ORDER — IBUPROFEN 200 MG PO TABS: ORAL_TABLET | ORAL | Status: DC

## 2014-05-30 NOTE — Progress Notes (Signed)
2 Days Post-Op  Subjective: She is feeling better.  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  She feels like she is sore but ready for discharge.  Objective: Vital signs in last 24 hours: Temp:  [97.8 F (36.6 C)-98.1 F (36.7 C)] 97.8 F (36.6 C) (08/06 0551) Pulse Rate:  [69-75] 75 (08/06 0551) Resp:  [16] 16 (08/06 0551) BP: (97-110)/(56-72) 108/72 mmHg (08/06 0551) SpO2:  [95 %-98 %] 95 % (08/06 0551) Last BM Date: 05/28/14 Afebrile, VSS No labs Heparin held because of porcine origin Intake/Output from previous day: 08/05 0701 - 08/06 0700 In: 2120 [P.O.:300; I.V.:1820] Out: 1450 [Urine:1450] Intake/Output this shift:    General appearance: alert, cooperative and no distress GI: soft sore, sites look fine eating fruit, + flatus No BM  Lab Results:   Recent Labs  05/28/14 1542 05/29/14 0438  WBC 12.4* 11.6*  HGB 11.7* 11.1*  HCT 36.2 34.8*  PLT 209 175    BMET  Recent Labs  05/28/14 1542  NA 137  K 3.8  CL 99  CO2 24  GLUCOSE 115*  BUN 6  CREATININE 0.61  CALCIUM 9.9   PT/INR No results found for this basename: LABPROT, INR,  in the last 72 hours   Recent Labs Lab 05/28/14 1542  AST 16  ALT 12  ALKPHOS 64  BILITOT 0.6  PROT 7.9  ALBUMIN 4.0     Lipase     Component Value Date/Time   LIPASE 21 05/28/2014 1542     Studies/Results: Ct Abdomen Pelvis W Contrast  05/28/2014   CLINICAL DATA:  Generalized abdominal pain and body aches for 2 days.  EXAM: CT ABDOMEN AND PELVIS WITH CONTRAST  TECHNIQUE: Multidetector CT imaging of the abdomen and pelvis was performed using the standard protocol following bolus administration of intravenous contrast.  CONTRAST:  139mL OMNIPAQUE IOHEXOL 300 MG/ML  SOLN  COMPARISON:  Ultrasound pelvis 07/13/2011  FINDINGS: Visualization of lower thorax demonstrates no consolidative or nodular pulmonary opacities. Normal heart size.  The liver, gallbladder, spleen, pancreas and bilateral adrenal  glands are unremarkable. Kidneys enhance symmetrically with contrast. No hydronephrosis.  Normal caliber abdominal aorta. No retroperitoneal lymphadenopathy. Multiple collateral vessels demonstrated within the pelvis. Prominence of the endometrium. Urinary bladder is unremarkable.  The appendix is dilated measuring up to 9 mm with surrounding periappendiceal fat stranding. There are multiple appendicoliths within the base of the appendix. No evidence for bowel obstruction. No free intraperitoneal air or fluid.  No aggressive or acute appearing osseous lesions.  IMPRESSION: Findings compatible with acute appendicitis. No evidence for perforation or periappendiceal abscess formation.  Prominence of the endometrium, recommend correlation with pelvic ultrasound.  Critical Value/emergent results were called by telephone at the time of interpretation on 05/28/2014 at 7:21 pm to Dr. Tanna Furry , who verbally acknowledged these results.   Electronically Signed   By: Lovey Newcomer M.D.   On: 05/28/2014 19:23    Medications: . docusate sodium  100 mg Oral BID  . ibuprofen  600 mg Oral TID    Assessment/Plan Acute appendicitis with localized peritonitis    Plan:  Home today.   LOS: 2 days    Iban Utz 05/30/2014

## 2014-05-30 NOTE — Progress Notes (Signed)
Nurse reviewed discharge instructions with pt.  Pt verbalized understanding of new medications, follow up appointments and new medications.  No concerns at time of discharge.

## 2014-05-30 NOTE — Discharge Summary (Signed)
Physician Discharge Summary  Patient ID: Sabrina [name] MRN: [id number] DOB/AGE: [birth year] 38 y.o.  Admit date: 05/28/2014 Discharge date: 05/30/2014  Admission Diagnoses:  Acute appendicitis with localized peritonitis    Discharge Diagnoses:  Acute appendicitis with localized peritonitis   Active Problems:   Appendicitis, acute   Acute appendicitis   PROCEDURES: APPENDECTOMY LAPAROSCOPIC, Surgeon: Excell Seltzer T, 05/28/2014.       Hospital Course: Patient is a 38 year old female who presents with symptoms and physical findings consistent with acute appendicitis. CT scan has shown evidence of acute appendicitis with several appendicoliths. I recommended proceeding with laparoscopic appendectomy. The procedure and indications and risks have been discussed in detail all swearing she and her husband are in agreement. She underwent surgery that evening and was sore, with headache and nausea the first post op day.  We kept her overnight and she was ready for d/c the following AM. Follow up 2-3 weeks with Dr. Excell Seltzer.  Condition on d/c:  Improved.   Disposition: 01-Home or Self Care     Medication List         acetaminophen 325 MG tablet  Commonly known as:  TYLENOL  Take 2 tablets (650 mg total) by mouth every 6 (six) hours as needed for mild pain or headache (Do not take more than 4000 mg of Tylenol (acetaminophen) per day.  it is in your prescription pain medicine.).     ibuprofen 200 MG tablet  Commonly known as:  ADVIL,MOTRIN  You can take 2-3 tablets every 6 hours as needed.     oxyCODONE-acetaminophen 5-325 MG per tablet  Commonly known as:  PERCOCET/ROXICET  Take 1-2 tablets by mouth every 4 (four) hours as needed for moderate pain.           Follow-up Information   Follow up with HOXWORTH,BENJAMIN T, MD. Schedule an appointment as soon as possible for a visit in 2 weeks.   Specialty:  General Surgery   Contact information:   [address] [place] [address] [phone]       Follow up with Logan Bores, MD. (Let her know you had surgery.)    Specialty:  Obstetrics and Gynecology   Contact information:   [address] [place] [address] [phone]       Signed: Earnstine Regal 05/30/2014, 1:35 PM

## 2014-05-30 NOTE — Discharge Instructions (Signed)
Laparoscopic Appendectomy °Care After °Refer to this sheet in the next few weeks. These instructions provide you with information on caring for yourself after your procedure. Your caregiver may also give you more specific instructions. Your treatment has been planned according to current medical practices, but problems sometimes occur. Call your caregiver if you have any problems or questions after your procedure. °HOME CARE INSTRUCTIONS °· Do not drive while taking narcotic pain medicines. °· Use stool softener if you become constipated from your pain medicines. °· Change your bandages (dressings) as directed. °· Keep your wounds clean and dry. You may wash the wounds gently with soap and water. Gently pat the wounds dry with a clean towel. °· Do not take baths, swim, or use hot tubs for 10 days, or as instructed by your caregiver. °· Only take over-the-counter or prescription medicines for pain, discomfort, or fever as directed by your caregiver. °· You may continue your normal diet as directed. °· Do not lift more than 10 pounds (4.5 kg) or play contact sports for 3 weeks, or as directed. °· Slowly increase your activity after surgery. °· Take deep breaths to avoid getting a lung infection (pneumonia). °SEEK MEDICAL CARE IF: °· You have redness, swelling, or increasing pain in your wounds. °· You have pus coming from your wounds. °· You have drainage from a wound that lasts longer than 1 day. °· You notice a bad smell coming from the wounds or dressing. °· Your wound edges break open after stitches (sutures) have been removed. °· You notice increasing pain in the shoulders (shoulder strap areas) or near your shoulder blades. °· You develop dizzy episodes or fainting while standing. °· You develop shortness of breath. °· You develop persistent nausea or vomiting. °· You cannot control your bowel functions or lose your appetite. °· You develop diarrhea. °SEEK IMMEDIATE MEDICAL CARE IF:  °· You have a fever. °· You  develop a rash. °· You have difficulty breathing or sharp pains in your chest. °· You develop any reaction or side effects to medicines given. °MAKE SURE YOU: °· Understand these instructions. °· Will watch your condition. °· Will get help right away if you are not doing well or get worse. °Document Released: 10/11/2005 Document Revised: 01/03/2012 Document Reviewed: 04/20/2011 °ExitCare® Patient Information ©2015 [employer]. This information is not intended to replace advice given to you by your health care provider. Make sure you discuss any questions you have with your health care provider. °[facility] ______[facility] °LAPAROSCOPIC SURGERY: POST OP INSTRUCTIONS °Always review your discharge instruction sheet given to you by the facility where your surgery was performed. °IF YOU HAVE DISABILITY OR FAMILY LEAVE FORMS, YOU MUST BRING THEM TO THE OFFICE FOR PROCESSING.   °DO NOT GIVE THEM TO YOUR DOCTOR. ° °1. A prescription for pain medication may be given to you upon discharge.  Take your pain medication as prescribed, if needed.  If narcotic pain medicine is not needed, then you may take acetaminophen (Tylenol) or ibuprofen (Advil) as needed. °2. Take your usually prescribed medications unless otherwise directed. °3. If you need a refill on your pain medication, please contact your pharmacy.  They will contact our office to request authorization. Prescriptions will not be filled after 5pm or on week-ends. °4. You should follow a light diet the first few days after arrival home, such as soup and crackers, etc.  Be sure to include lots of fluids daily. °5. Most patients will experience some swelling and bruising   in the area of the incisions.  Ice packs will help.  Swelling and bruising can take several days to resolve.  °6. It is common to experience some constipation if taking pain medication after surgery.  Increasing fluid intake and taking a stool softener (such as Colace) will usually help or  prevent this problem from occurring.  A mild laxative (Milk of Magnesia or Miralax) should be taken according to package instructions if there are no bowel movements after 48 hours. °7. Unless discharge instructions indicate otherwise, you may remove your bandages 24-48 hours after surgery, and you may shower at that time.  You may have steri-strips (small skin tapes) in place directly over the incision.  These strips should be left on the skin for 7-10 days.  If your surgeon used skin glue on the incision, you may shower in 24 hours.  The glue will flake off over the next 2-3 weeks.  Any sutures or staples will be removed at the office during your follow-up visit. °8. ACTIVITIES:  You may resume regular (light) daily activities beginning the next day--such as daily self-care, walking, climbing stairs--gradually increasing activities as tolerated.  You may have sexual intercourse when it is comfortable.  Refrain from any heavy lifting or straining until approved by your doctor. °a. You may drive when you are no longer taking prescription pain medication, you can comfortably wear a seatbelt, and you can safely maneuver your car and apply brakes. °b. RETURN TO WORK:  __________________________________________________________ °9. You should see your doctor in the office for a follow-up appointment approximately 2-3 weeks after your surgery.  Make sure that you call for this appointment within a day or two after you arrive home to insure a convenient appointment time. °10. OTHER INSTRUCTIONS: __________________________________________________________________________________________________________________________ __________________________________________________________________________________________________________________________ °WHEN TO CALL YOUR DOCTOR: °1. Fever over 101.0 °2. Inability to urinate °3. Continued bleeding from incision. °4. Increased pain, redness, or drainage from the incision. °5. Increasing  abdominal pain ° °The clinic staff is available to answer your questions during regular business hours.  Please don’t hesitate to call and ask to speak to one of the nurses for clinical concerns.  If you have a medical emergency, go to the nearest emergency room or call 911.  A surgeon from [facility] is always on call at the hospital. °[address], [address], [place], [place]  [address] ? [address] [address], [place], [place]   [address] °[phone] ? [phone] ? FAX [phone] °Web site: [email or web address] ° °

## 2014-06-20 ENCOUNTER — Ambulatory Visit (INDEPENDENT_AMBULATORY_CARE_PROVIDER_SITE_OTHER): Payer: Self-pay | Admitting: General Surgery

## 2014-06-20 ENCOUNTER — Encounter (INDEPENDENT_AMBULATORY_CARE_PROVIDER_SITE_OTHER): Payer: Self-pay | Admitting: General Surgery

## 2014-06-20 VITALS — BP 122/80 | HR 67 | Temp 98.0°F | Ht 67.0 in | Wt 149.0 lb

## 2014-06-20 DIAGNOSIS — Z09 Encounter for follow-up examination after completed treatment for conditions other than malignant neoplasm: Secondary | ICD-10-CM

## 2014-06-20 NOTE — Progress Notes (Signed)
Chief complaint: Followup appendectomy  History: Patient returns approximately 2 weeks following emergency laparoscopic appendectomy for acute appendicitis. She notices still some soreness around her umbilicus and fatigue. Generally feeling gradually better.  Exam: BP 122/80  Pulse 67  Temp(Src) 98 F (36.7 C)  Ht 5\' 7"  (1.702 m)  Wt 149 lb (67.586 kg)  BMI 23.33 kg/m2  LMP 05/19/2014 General: Appears well Abdomen: Soft and minimal tenderness only around the umbilical incision. All wounds appear well healed  Pathology showed acute appendicitis  Assessment and plan: I think she is recovering well following her appendectomy. Some incisional soreness and fatigue I told her would be normal at this point and should resolve over the next couple of weeks. She will let us know if she is not feeling better.

## 2014-08-26 ENCOUNTER — Encounter (INDEPENDENT_AMBULATORY_CARE_PROVIDER_SITE_OTHER): Payer: Self-pay | Admitting: General Surgery

## 2015-08-22 LAB — OB RESULTS CONSOLE ABO/RH: RH Type: POSITIVE

## 2015-08-22 LAB — OB RESULTS CONSOLE GBS: STREP GROUP B AG: POSITIVE

## 2015-08-22 LAB — OB RESULTS CONSOLE ANTIBODY SCREEN: ANTIBODY SCREEN: NEGATIVE

## 2015-08-22 LAB — OB RESULTS CONSOLE GC/CHLAMYDIA
Chlamydia: NEGATIVE
Gonorrhea: NEGATIVE

## 2015-08-22 LAB — OB RESULTS CONSOLE HEPATITIS B SURFACE ANTIGEN: HEP B S AG: NEGATIVE

## 2015-08-22 LAB — OB RESULTS CONSOLE RUBELLA ANTIBODY, IGM: Rubella: IMMUNE

## 2015-10-06 LAB — OB RESULTS CONSOLE RPR: RPR: NONREACTIVE

## 2015-10-06 LAB — OB RESULTS CONSOLE HIV ANTIBODY (ROUTINE TESTING): HIV: NONREACTIVE

## 2015-10-26 NOTE — L&D Delivery Note (Addendum)
Delivery Note At 3:21 AM a viable and healthy female was delivered via Vaginal, Spontaneous Delivery (Presentation: Right Occiput Anterior).  APGAR: 9, 10; weight P .   Placenta status: Abnormal, Spontaneous.  Cord: 3 vessels with the following complications: None.    Anesthesia: Epidural  Episiotomy: None Lacerations: None Suture Repair: N/A Est. Blood Loss (mL): 500  Mom to postpartum.  Baby to Couplet care / Skin to Skin.  Bovard-Stuckert, Adam Demary 12/20/2015, 3:39 AM  Br/O+/RI/Br/?Tdap   Directly prior to deliver decel to 70-90's for 22min, Pt rapidly change to 10/100/+2/  Blood clot at delivery.  At delivery of placenta - clot adherent to placenta.

## 2015-12-05 ENCOUNTER — Telehealth (HOSPITAL_COMMUNITY): Payer: Self-pay | Admitting: *Deleted

## 2015-12-05 ENCOUNTER — Encounter (HOSPITAL_COMMUNITY): Payer: Self-pay | Admitting: *Deleted

## 2015-12-05 NOTE — Telephone Encounter (Signed)
Preadmission screen  

## 2015-12-18 ENCOUNTER — Encounter (HOSPITAL_COMMUNITY): Payer: Self-pay

## 2015-12-18 DIAGNOSIS — Z3483 Encounter for supervision of other normal pregnancy, third trimester: Secondary | ICD-10-CM

## 2015-12-18 HISTORY — DX: Encounter for supervision of other normal pregnancy, third trimester: Z34.83

## 2015-12-18 NOTE — H&P (Signed)
[name] [name] is a 40 y.o. female [id number] at 39+ for IOL given favorable at term.  Pregnancy complicated by AMA and h/o GDM - neg screening x 2 this pregnancy.  +FM, no LOF, no VB, occ ctx.  D/w pt process and Plan for IOL, pt voices understanding wishes to proceed.  GBBS positive.  Late entry to Prenatal care about 20 weeks.     Maternal Medical History:  Contractions: Frequency: irregular.    Fetal activity: Perceived fetal activity is normal.    Prenatal Complications - Diabetes: none.    OB History    Gravida Para Term Preterm AB TAB SAB Ectopic Multiple Living   6 4 4  0 1 0 1 0 0 4    no abn pap No STD [id number] SVD x 4, 7.5-8# female, female x 3 G6 present  Past Medical History  Diagnosis Date  . No pertinent past medical history   . Hx of gestational diabetes in prior pregnancy, currently pregnant   . Anemia   . AMA (advanced maternal age) multigravida 9+   . Fibroid   . Normal pregnancy in multigravida in third trimester 12/18/2015   Past Surgical History  Procedure Laterality Date  . No past surgeries    . Laparoscopic appendectomy N/A 05/28/2014    Procedure: APPENDECTOMY LAPAROSCOPIC;  Surgeon: Edward Jolly, MD;  Location: [facility];  Service: General;  Laterality: N/A;  . Appendectomy     Family History: family history includes Depression in her maternal grandfather, maternal grandmother, paternal grandfather, and paternal grandmother; Heart attack in her father; Hypertension in her father, mother, and sister. Social History:  reports that she has never smoked. She has never used smokeless tobacco. She reports that she does not drink alcohol or use illicit drugs. married, professor Meds IRON All NKDA   Prenatal Transfer Tool  Maternal Diabetes: No Genetic Screening: Declined Maternal Ultrasounds/Referrals: Normal Fetal Ultrasounds or other Referrals:  None Maternal Substance Abuse:  No Significant Maternal Medications:  None Significant Maternal Lab Results:   Lab values include: Group B Strep positive Other Comments:  None  Review of Systems  Constitutional: Negative.   HENT: Negative.   Eyes: Negative.   Respiratory: Negative.   Cardiovascular: Negative.   Gastrointestinal: Negative.   Genitourinary: Negative.   Musculoskeletal: Negative.   Skin: Negative.   Neurological: Negative.   Psychiatric/Behavioral: Negative.       Last menstrual period 03/18/2015, unknown if currently breastfeeding. Maternal Exam:  Uterine Assessment: Contraction strength is moderate.  Contraction frequency is regular.   Abdomen: Patient reports no abdominal tenderness. Fundal height is appropriate for gestation.   Estimated fetal weight is 7.5-8.5#.   Fetal presentation: vertex  Introitus: Normal vulva. Normal vagina.  Pelvis: adequate for delivery.   Cervix: Cervix evaluated by digital exam.     Physical Exam  Constitutional: She is oriented to person, place, and time. She appears well-developed and well-nourished.  HENT:  Head: Normocephalic and atraumatic.  Cardiovascular: Normal rate and regular rhythm.   Respiratory: Effort normal and breath sounds normal. No respiratory distress. She has no wheezes.  GI: Soft. Bowel sounds are normal. She exhibits no distension. There is no tenderness.  Musculoskeletal: Normal range of motion.  Neurological: She is alert and oriented to person, place, and time.  Skin: Skin is warm and dry.  Psychiatric: She has a normal mood and affect. Her behavior is normal.    Prenatal labs: ABO, Rh: O/Positive/-- (10/28 0000) Antibody: Negative (10/28 0000) Rubella:  immune  RPR: Nonreactive (12/12 0000)  HBsAg:   neg HIV: Non-reactive (12/12 0000)  GBS: Positive (10/28 0000)   Pap WNL/Hgb 10.3/Plt 227/glucola 95 and 119/GC neg/Chl neg  Nl anat cwd, fwmal  Assessment/Plan: 39yo [id number] at 39+ for IOL given term status and favorable cervix gbbs + - prophylaxis before AROM Pitocin and AROM to induce Expect SVD    [name] [name] Sabrina [name] 12/18/2015, 8:24 PM

## 2015-12-19 ENCOUNTER — Inpatient Hospital Stay (HOSPITAL_COMMUNITY)
Admission: RE | Admit: 2015-12-19 | Discharge: 2015-12-22 | DRG: 775 | Disposition: A | Discharge status: Home / Self Care | Payer: Medicaid Other | Source: Ambulatory Visit | Attending: Obstetrics and Gynecology | Admitting: Obstetrics and Gynecology

## 2015-12-19 ENCOUNTER — Inpatient Hospital Stay (HOSPITAL_COMMUNITY): Payer: Medicaid Other | Admitting: Anesthesiology

## 2015-12-19 ENCOUNTER — Encounter (HOSPITAL_COMMUNITY): Payer: Self-pay

## 2015-12-19 VITALS — BP 137/74 | HR 83 | Temp 98.7°F | Resp 16 | Ht 65.0 in | Wt 176.0 lb

## 2015-12-19 DIAGNOSIS — Z8632 Personal history of gestational diabetes: Secondary | ICD-10-CM | POA: Diagnosis not present

## 2015-12-19 DIAGNOSIS — Z3483 Encounter for supervision of other normal pregnancy, third trimester: Secondary | ICD-10-CM

## 2015-12-19 DIAGNOSIS — O99824 Streptococcus B carrier state complicating childbirth: Secondary | ICD-10-CM | POA: Diagnosis present

## 2015-12-19 DIAGNOSIS — Z3480 Encounter for supervision of other normal pregnancy, unspecified trimester: Secondary | ICD-10-CM

## 2015-12-19 DIAGNOSIS — Z3A39 39 weeks gestation of pregnancy: Secondary | ICD-10-CM | POA: Diagnosis not present

## 2015-12-19 HISTORY — DX: Encounter for supervision of other normal pregnancy, third trimester: Z34.83

## 2015-12-19 LAB — CBC
HCT: 28.8 % — ABNORMAL LOW (ref 36.0–46.0)
HEMOGLOBIN: 8.8 g/dL — AB (ref 12.0–15.0)
MCH: 21.4 pg — ABNORMAL LOW (ref 26.0–34.0)
MCHC: 30.6 g/dL (ref 30.0–36.0)
MCV: 70.1 fL — ABNORMAL LOW (ref 78.0–100.0)
Platelets: 197 10*3/uL (ref 150–400)
RBC: 4.11 MIL/uL (ref 3.87–5.11)
RDW: 18.4 % — ABNORMAL HIGH (ref 11.5–15.5)
WBC: 8.3 10*3/uL (ref 4.0–10.5)

## 2015-12-19 LAB — TYPE AND SCREEN
ABO/RH(D): O POS
ANTIBODY SCREEN: NEGATIVE

## 2015-12-19 MED ORDER — PENICILLIN G POTASSIUM 5000000 UNITS IJ SOLR
5.0000 10*6.[IU] | Freq: Once | INTRAVENOUS | Status: AC
  Administered 2015-12-19: 5 10*6.[IU] via INTRAVENOUS
  Filled 2015-12-19: qty 5

## 2015-12-19 MED ORDER — EPHEDRINE 5 MG/ML INJ
10.0000 mg | INTRAVENOUS | Status: DC | PRN
Start: 2015-12-19 — End: 2015-12-22
  Filled 2015-12-19: qty 2

## 2015-12-19 MED ORDER — LACTATED RINGERS IV SOLN: 500.0000 mL | INTRAVENOUS | Status: DC | PRN

## 2015-12-19 MED ORDER — BUTORPHANOL TARTRATE 1 MG/ML IJ SOLN: 1.0000 mg | INTRAMUSCULAR | Status: DC | PRN

## 2015-12-19 MED ORDER — EPHEDRINE 5 MG/ML INJ
10.0000 mg | INTRAVENOUS | Status: DC | PRN
  Filled 2015-12-19: qty 2

## 2015-12-19 MED ORDER — CITRIC ACID-SODIUM CITRATE 334-500 MG/5ML PO SOLN: 30.0000 mL | ORAL | Status: DC | PRN

## 2015-12-19 MED ORDER — LIDOCAINE HCL (PF) 1 % IJ SOLN
INTRAMUSCULAR | Status: DC | PRN
  Administered 2015-12-19 (×2): 4 mL via EPIDURAL

## 2015-12-19 MED ORDER — ACETAMINOPHEN 325 MG PO TABS
650.0000 mg | ORAL_TABLET | ORAL | Status: DC | PRN
  Administered 2015-12-20: 650 mg via ORAL
  Filled 2015-12-19: qty 2

## 2015-12-19 MED ORDER — OXYCODONE-ACETAMINOPHEN 5-325 MG PO TABS: 1.0000 | ORAL_TABLET | ORAL | Status: DC | PRN

## 2015-12-19 MED ORDER — OXYCODONE-ACETAMINOPHEN 5-325 MG PO TABS: 2.0000 | ORAL_TABLET | ORAL | Status: DC | PRN

## 2015-12-19 MED ORDER — PHENYLEPHRINE 40 MCG/ML (10ML) SYRINGE FOR IV PUSH (FOR BLOOD PRESSURE SUPPORT)
80.0000 ug | PREFILLED_SYRINGE | INTRAVENOUS | Status: DC | PRN
  Filled 2015-12-19: qty 2
  Filled 2015-12-19: qty 20

## 2015-12-19 MED ORDER — DIPHENHYDRAMINE HCL 50 MG/ML IJ SOLN: 12.5000 mg | INTRAMUSCULAR | Status: DC | PRN

## 2015-12-19 MED ORDER — LACTATED RINGERS IV SOLN: 500.0000 mL | Freq: Once | INTRAVENOUS | Status: DC

## 2015-12-19 MED ORDER — OXYTOCIN BOLUS FROM INFUSION
500.0000 mL | INTRAVENOUS | Status: DC
  Administered 2015-12-20: 500 mL via INTRAVENOUS

## 2015-12-19 MED ORDER — LIDOCAINE HCL (PF) 1 % IJ SOLN
30.0000 mL | INTRAMUSCULAR | Status: DC | PRN
  Filled 2015-12-19: qty 30

## 2015-12-19 MED ORDER — LACTATED RINGERS IV SOLN
INTRAVENOUS | Status: DC
  Administered 2015-12-19 (×2): via INTRAVENOUS

## 2015-12-19 MED ORDER — TERBUTALINE SULFATE 1 MG/ML IJ SOLN
0.2500 mg | Freq: Once | INTRAMUSCULAR | Status: DC | PRN
  Filled 2015-12-19: qty 1

## 2015-12-19 MED ORDER — OXYTOCIN 10 UNIT/ML IJ SOLN
2.5000 [IU]/h | INTRAMUSCULAR | Status: DC
Start: 2015-12-19 — End: 2015-12-20

## 2015-12-19 MED ORDER — OXYTOCIN 10 UNIT/ML IJ SOLN
1.0000 m[IU]/min | INTRAMUSCULAR | Status: DC
  Administered 2015-12-19: 8 m[IU]/min via INTRAVENOUS
  Administered 2015-12-19: 2 m[IU]/min via INTRAVENOUS
  Filled 2015-12-19: qty 4

## 2015-12-19 MED ORDER — FENTANYL 2.5 MCG/ML BUPIVACAINE 1/10 % EPIDURAL INFUSION (WH - ANES)
14.0000 mL/h | INTRAMUSCULAR | Status: DC | PRN
  Administered 2015-12-19: 14 mL/h via EPIDURAL
  Filled 2015-12-19: qty 125

## 2015-12-19 MED ORDER — ONDANSETRON HCL 4 MG/2ML IJ SOLN: 4.0000 mg | Freq: Four times a day (QID) | INTRAMUSCULAR | Status: DC | PRN

## 2015-12-19 MED ORDER — PHENYLEPHRINE 40 MCG/ML (10ML) SYRINGE FOR IV PUSH (FOR BLOOD PRESSURE SUPPORT)
80.0000 ug | PREFILLED_SYRINGE | INTRAVENOUS | Status: DC | PRN
  Filled 2015-12-19: qty 2

## 2015-12-19 MED ORDER — PENICILLIN G POTASSIUM 5000000 UNITS IJ SOLR
2.5000 10*6.[IU] | INTRAVENOUS | Status: DC
  Administered 2015-12-19 – 2015-12-20 (×2): 2.5 10*6.[IU] via INTRAVENOUS
  Filled 2015-12-19 (×6): qty 2.5

## 2015-12-19 NOTE — Anesthesia Preprocedure Evaluation (Signed)
Anesthesia Evaluation  Patient identified by MRN, date of birth, ID band Patient awake    Reviewed: Allergy & Precautions, NPO status , Patient's Chart, lab work & pertinent test results  Airway Mallampati: II  TM Distance: >3 FB Neck ROM: Full    Dental no notable dental hx. (+) Teeth Intact   Pulmonary neg pulmonary ROS,    Pulmonary exam normal breath sounds clear to auscultation       Cardiovascular negative cardio ROS Normal cardiovascular exam Rhythm:Regular Rate:Normal     Neuro/Psych negative neurological ROS  negative psych ROS   GI/Hepatic Neg liver ROS, GERD  ,  Endo/Other  negative endocrine ROS  Renal/GU negative Renal ROS  negative genitourinary   Musculoskeletal negative musculoskeletal ROS (+)   Abdominal   Peds  Hematology  (+) anemia ,   Anesthesia Other Findings   Reproductive/Obstetrics (+) Pregnancy AMA                             Anesthesia Physical Anesthesia Plan  ASA: II  Anesthesia Plan: Epidural   Post-op Pain Management:    Induction:   Airway Management Planned: Natural Airway  Additional Equipment:   Intra-op Plan:   Post-operative Plan:   Informed Consent: I have reviewed the patients History and Physical, chart, labs and discussed the procedure including the risks, benefits and alternatives for the proposed anesthesia with the patient or authorized representative who has indicated his/her understanding and acceptance.     Plan Discussed with: Anesthesiologist  Anesthesia Plan Comments:         Anesthesia Quick Evaluation

## 2015-12-19 NOTE — Progress Notes (Signed)
Patient ID: Sabrina [name], female   DOB: 12/15/1975, 40 y.o.   MRN: [id number]  Comfortable with epidural  AFVSS gen NAD FHTs 130-140's, good var, category 1 toco q 85min  SVE 4.5/50/-2  IUPC placed w/o diff/comp.  Continue IOL - IUPC to help with pitocin Expect SVD

## 2015-12-19 NOTE — Anesthesia Procedure Notes (Signed)
Epidural Patient location during procedure: OB Start time: 12/19/2015 9:22 PM  Staffing Anesthesiologist: Josephine Igo Performed by: anesthesiologist   Preanesthetic Checklist Completed: patient identified, site marked, surgical consent, pre-op evaluation, timeout performed, IV checked, risks and benefits discussed and monitors and equipment checked  Epidural Patient position: sitting Prep: site prepped and draped and DuraPrep Patient monitoring: continuous pulse ox and blood pressure Approach: midline Location: L3-L4 Injection technique: LOR air  Needle:  Needle type: Tuohy  Needle gauge: 17 G Needle length: 9 cm and 9 Needle insertion depth: 5 cm cm Catheter type: closed end flexible Catheter size: 19 Gauge Catheter at skin depth: 10 cm Test dose: negative and Other  Assessment Events: blood not aspirated, injection not painful, no injection resistance, negative IV test and no paresthesia  Additional Notes Patient identified. Risks and benefits discussed including failed block, incomplete  Pain control, post dural puncture headache, nerve damage, paralysis, blood pressure Changes, nausea, vomiting, reactions to medications-both toxic and allergic and post Partum back pain. All questions were answered. Patient expressed understanding and wished to proceed. Sterile technique was used throughout procedure. Epidural site was Dressed with sterile barrier dressing. No paresthesias, signs of intravascular injection Or signs of intrathecal spread were encountered.  Patient was more comfortable after the epidural was dosed. Please see RN's note for documentation of vital signs and FHR which are stable.

## 2015-12-19 NOTE — Consults (Signed)
  Anesthesia Pain Consult Note  Patient: Sabrina [name], 40 y.o., female  Consult Requested by: Janyth Contes, MD  Reason for Consult:  OB CRNA Rounding  Pain level 6 Pain goal 2  Epidural place 2130, patient states pain lessening now

## 2015-12-20 ENCOUNTER — Encounter (HOSPITAL_COMMUNITY): Payer: Self-pay

## 2015-12-20 LAB — GLUCOSE, CAPILLARY: Glucose-Capillary: 78 mg/dL (ref 65–99)

## 2015-12-20 LAB — CBC
HCT: 26.3 % — ABNORMAL LOW (ref 36.0–46.0)
Hemoglobin: 8.2 g/dL — ABNORMAL LOW (ref 12.0–15.0)
MCH: 21.7 pg — AB (ref 26.0–34.0)
MCHC: 31.2 g/dL (ref 30.0–36.0)
MCV: 69.6 fL — ABNORMAL LOW (ref 78.0–100.0)
PLATELETS: 202 10*3/uL (ref 150–400)
RBC: 3.78 MIL/uL — AB (ref 3.87–5.11)
RDW: 18.3 % — ABNORMAL HIGH (ref 11.5–15.5)
WBC: 11.7 10*3/uL — ABNORMAL HIGH (ref 4.0–10.5)

## 2015-12-20 LAB — RPR: RPR: NONREACTIVE

## 2015-12-20 MED ORDER — DIPHENHYDRAMINE HCL 25 MG PO CAPS: 25.0000 mg | ORAL_CAPSULE | Freq: Four times a day (QID) | ORAL | Status: DC | PRN

## 2015-12-20 MED ORDER — SIMETHICONE 80 MG PO CHEW: 80.0000 mg | CHEWABLE_TABLET | ORAL | Status: DC | PRN

## 2015-12-20 MED ORDER — ONDANSETRON HCL 4 MG/2ML IJ SOLN
4.0000 mg | INTRAMUSCULAR | Status: DC | PRN
Start: 2015-12-20 — End: 2015-12-22

## 2015-12-20 MED ORDER — ZOLPIDEM TARTRATE 5 MG PO TABS: 5.0000 mg | ORAL_TABLET | Freq: Every evening | ORAL | Status: DC | PRN

## 2015-12-20 MED ORDER — WITCH HAZEL-GLYCERIN EX PADS: 1.0000 "application " | MEDICATED_PAD | CUTANEOUS | Status: DC | PRN

## 2015-12-20 MED ORDER — ONDANSETRON HCL 4 MG PO TABS: 4.0000 mg | ORAL_TABLET | ORAL | Status: DC | PRN

## 2015-12-20 MED ORDER — PRENATAL MULTIVITAMIN CH
1.0000 | ORAL_TABLET | Freq: Every day | ORAL | Status: DC
  Administered 2015-12-20 – 2015-12-22 (×3): 1 via ORAL
  Filled 2015-12-20 (×3): qty 1

## 2015-12-20 MED ORDER — BENZOCAINE-MENTHOL 20-0.5 % EX AERO: 1.0000 "application " | INHALATION_SPRAY | CUTANEOUS | Status: DC | PRN

## 2015-12-20 MED ORDER — TETANUS-DIPHTH-ACELL PERTUSSIS 5-2.5-18.5 LF-MCG/0.5 IM SUSP: 0.5000 mL | Freq: Once | INTRAMUSCULAR | Status: DC

## 2015-12-20 MED ORDER — SENNOSIDES-DOCUSATE SODIUM 8.6-50 MG PO TABS
2.0000 | ORAL_TABLET | ORAL | Status: DC
  Administered 2015-12-21 (×2): 2 via ORAL
  Filled 2015-12-20 (×2): qty 2

## 2015-12-20 MED ORDER — LACTATED RINGERS IV SOLN
INTRAVENOUS | Status: DC
Start: 2015-12-20 — End: 2015-12-22

## 2015-12-20 MED ORDER — OXYCODONE-ACETAMINOPHEN 5-325 MG PO TABS: 2.0000 | ORAL_TABLET | ORAL | Status: DC | PRN

## 2015-12-20 MED ORDER — IBUPROFEN 600 MG PO TABS
600.0000 mg | ORAL_TABLET | Freq: Four times a day (QID) | ORAL | Status: DC
  Administered 2015-12-20 – 2015-12-22 (×10): 600 mg via ORAL
  Filled 2015-12-20 (×11): qty 1

## 2015-12-20 MED ORDER — OXYCODONE-ACETAMINOPHEN 5-325 MG PO TABS: 1.0000 | ORAL_TABLET | ORAL | Status: DC | PRN

## 2015-12-20 MED ORDER — LANOLIN HYDROUS EX OINT: TOPICAL_OINTMENT | CUTANEOUS | Status: DC | PRN

## 2015-12-20 MED ORDER — DIBUCAINE 1 % RE OINT
1.0000 | TOPICAL_OINTMENT | RECTAL | Status: DC | PRN
Start: 2015-12-20 — End: 2015-12-22

## 2015-12-20 MED ORDER — ACETAMINOPHEN 325 MG PO TABS
650.0000 mg | ORAL_TABLET | ORAL | Status: DC | PRN
  Administered 2015-12-20: 650 mg via ORAL
  Filled 2015-12-20: qty 2

## 2015-12-20 NOTE — Lactation Note (Signed)
This note was copied from a baby's chart. Lactation Consultation Note  Patient Name: Sabrina [name] [id number] Date: 12/20/2015 Reason for consult: Initial assessment;Other (Comment) (expereinced Breastfeeder )  Baby is 60 hours old and baby has been to the breast several times today . LC updated  Doc flow sheets per mom . @ the start of the [facility] consult baby latched with depth and swallows noted.  Mom C/o intense cramping, and when asked when she had emptied her bladder it had been 3 hours . LC  Recommended to mom prior to every feeding to empty her bladder 1st,until cramping improves.  Baby re-latched with assistance and depth , swallows noted, increased with breast compressions.  LC reviewed basics with mom , especially skin to skin feedings , and not pulling breast tissue away from baby when latched. Mother informed of post-discharge support and given phone number to the lactation department, including services for phone  call assistance; out-patient appointments; and breastfeeding support group. List of other breastfeeding resources in the community  given in the handout. Encouraged mother to call for problems or concerns related to breastfeeding.   Maternal Data Has patient been taught Hand Expression?: Yes Does the patient have breastfeeding experience prior to this delivery?: Yes  Feeding Feeding Type: Breast Fed Length of feed:  (still feeding at 10 mins )  LATCH Score/Interventions Latch: Grasps breast easily, tongue down, lips flanged, rhythmical sucking.  Audible Swallowing: Spontaneous and intermittent  Type of Nipple: Everted at rest and after stimulation  Comfort (Breast/Nipple): Soft / non-tender     Hold (Positioning): Assistance needed to correctly position infant at breast and maintain latch. Intervention(s): Breastfeeding basics reviewed;Support Pillows;Position options;Skin to skin  LATCH Score: 9  Lactation Tools Discussed/Used     Consult  Status Consult Status: Follow-up Date: 12/21/15 Follow-up type: In-patient    Myer Haff 12/20/2015, 4:50 PM

## 2015-12-20 NOTE — Anesthesia Postprocedure Evaluation (Signed)
Anesthesia Post Note  Patient: Sabrina [name]  Procedure(s) Performed: * No procedures listed *  Patient location during evaluation: Mother Baby Anesthesia Type: Epidural Level of consciousness: awake and alert and oriented Pain management: satisfactory to patient Vital Signs Assessment: post-procedure vital signs reviewed and stable Respiratory status: spontaneous breathing and nonlabored ventilation Cardiovascular status: stable Postop Assessment: no headache, no backache, no signs of nausea or vomiting, adequate PO intake and patient able to bend at knees (patient up walking) Anesthetic complications: no    Last Vitals:  Filed Vitals:   12/20/15 0615 12/20/15 1100  BP: 135/67 121/75  Pulse: 80 78  Temp: 36.8 C 36.8 C  Resp: 18 16    Last Pain:  Filed Vitals:   12/20/15 1243  PainSc: 6                  Guthrie Lemme

## 2015-12-20 NOTE — Progress Notes (Signed)
Patient c/o headache, feeling weak, and overall "not feeling good." Patient requested CBG to be tested. CBG was 78, assured patient it was normal. Encouraged to continue to drink sips of clear liquids & juices.

## 2015-12-20 NOTE — Progress Notes (Signed)
Post Partum Day 0 Subjective: no complaints, up ad lib, voiding, tolerating PO and nl lochia, pain controlled  Objective: Blood pressure 135/67, pulse 80, temperature 98.2 F (36.8 C), temperature source Oral, resp. rate 18, height 5\' 5"  (1.651 m), weight 79.833 kg (176 lb), last menstrual period 03/18/2015, SpO2 100 %, unknown if currently breastfeeding.  Physical Exam:  General: alert and no distress Lochia: appropriate Uterine Fundus: firm   Recent Labs  12/19/15 1500 12/20/15 0541  HGB 8.8* 8.2*  HCT 28.8* 26.3*    Assessment/Plan: Plan for discharge tomorrow or Monday.  Routine PP care.  Lactation consult.     LOS: 1 day   Bovard-Stuckert, Charnette Younkin 12/20/2015, 8:13 AM

## 2015-12-21 NOTE — Progress Notes (Signed)
Post Partum Day 1 Subjective: no complaints, up ad lib, voiding, tolerating PO and nl lochia, pain controlled  Objective: Blood pressure 131/65, pulse 68, temperature 98.5 F (36.9 C), temperature source Oral, resp. rate 18, height 5\' 5"  (1.651 m), weight 79.833 kg (176 lb), last menstrual period 03/18/2015, SpO2 100 %, unknown if currently breastfeeding.  Physical Exam:  General: alert and no distress Lochia: appropriate Uterine Fundus: firm   Recent Labs  12/19/15 1500 12/20/15 0541  HGB 8.8* 8.2*  HCT 28.8* 26.3*    Assessment/Plan: Plan for discharge tomorrow, Breastfeeding and Lactation consult.  Routine PP care   LOS: 2 days   Bovard-Stuckert, Sabrina [name] 12/21/2015, 8:35 AM

## 2015-12-22 MED ORDER — IBUPROFEN 800 MG PO TABS: 800.0000 mg | ORAL_TABLET | Freq: Three times a day (TID) | ORAL | Status: DC | PRN

## 2015-12-22 MED ORDER — PRENATAL MULTIVITAMIN CH: 1.0000 | ORAL_TABLET | Freq: Every day | ORAL | Status: DC

## 2015-12-22 NOTE — Discharge Summary (Signed)
OB Discharge Summary     Patient Name: Sabrina [name] DOB: [birth year] MRN: [id number]  Date of admission: 12/19/2015 Delivering MD: Janyth Contes   Date of discharge: 12/22/2015  Admitting diagnosis: INDUCTION Intrauterine pregnancy: [redacted]w[redacted]d     Secondary diagnosis:  Principal Problem:   SVD (spontaneous vaginal delivery) Active Problems:   Normal pregnancy in multigravida in third trimester   Other normal pregnancy, not first  Additional problems: N/A     Discharge diagnosis: Term Pregnancy Delivered                                                                                                Post partum procedures:N/A  Augmentation: AROM and Pitocin  Complications: None  Hospital course:  Induction of Labor With Vaginal Delivery   40 y.o. yo [id number] at [redacted]w[redacted]d was admitted to the hospital 12/19/2015 for induction of labor.  Indication for induction: Favorable cervix at term.  Patient had an uncomplicated labor course as follows: Membrane Rupture Time/Date: 8:46 PM ,12/19/2015   Intrapartum Procedures: Episiotomy: None [1]                                         Lacerations:  None [1]  Patient had delivery of a Viable infant.  Information for the patient's newborn:  Sahanna, Eastep Girl Bhavya [id number]  Delivery Method: Vag-Spont   12/20/2015  Details of delivery can be found in separate delivery note.  Patient had a routine postpartum course. Patient is discharged home 12/22/2015.   Physical exam  Filed Vitals:   12/20/15 1806 12/21/15 0650 12/21/15 1907 12/22/15 0636  BP: 136/73 131/65 138/78 137/74  Pulse: 74 68 97 83  Temp: 98.1 F (36.7 C) 98.5 F (36.9 C) 98.3 F (36.8 C) 98.7 F (37.1 C)  TempSrc: Oral Oral Oral Oral  Resp: 20 18 18 16   Height:      Weight:      SpO2: 100%  99%    General: alert and no distress Lochia: appropriate Uterine Fundus: firm  Labs: Lab Results  Component Value Date   WBC 11.7* 12/20/2015   HGB 8.2* 12/20/2015   HCT 26.3*  12/20/2015   MCV 69.6* 12/20/2015   PLT 202 12/20/2015   CMP Latest Ref Rng 05/28/2014  Glucose 70 - 99 mg/dL 115(H)  BUN 6 - 23 mg/dL 6  Creatinine 0.50 - 1.10 mg/dL 0.61  Sodium 137 - 147 mEq/L 137  Potassium 3.7 - 5.3 mEq/L 3.8  Chloride 96 - 112 mEq/L 99  CO2 19 - 32 mEq/L 24  Calcium 8.4 - 10.5 mg/dL 9.9  Total Protein 6.0 - 8.3 g/dL 7.9  Total Bilirubin 0.3 - 1.2 mg/dL 0.6  Alkaline Phos 39 - 117 U/L 64  AST 0 - 37 U/L 16  ALT 0 - 35 U/L 12    Discharge instruction: per After Visit Summary and "Baby and Me Booklet".  After visit meds:    Medication List    TAKE these medications  acetaminophen 325 MG tablet  Commonly known as:  TYLENOL  Take 2 tablets (650 mg total) by mouth every 6 (six) hours as needed for mild pain or headache (Do not take more than 4000 mg of Tylenol (acetaminophen) per day.  it is in your prescription pain medicine.).     ibuprofen 800 MG tablet  Commonly known as:  ADVIL,MOTRIN  Take 1 tablet (800 mg total) by mouth every 8 (eight) hours as needed for moderate pain.     prenatal multivitamin Tabs tablet  Take 1 tablet by mouth daily at 12 noon.        Diet: routine diet  Activity: Advance as tolerated. Pelvic rest for 6 weeks.   Outpatient follow up:6 weeks Follow up Appt:No future appointments. Follow up Visit:No Follow-up on file.  Postpartum contraception: Undecided  Newborn Data: Live born female  Birth Weight: 7 lb 7.8 oz (3395 g) APGAR: 9, 10  Baby Feeding: Breast Disposition:home with mother   12/22/2015 Janyth Contes, MD

## 2015-12-22 NOTE — Lactation Note (Addendum)
This note was copied from a baby's chart. Lactation Consultation Note  Patient Name: Sabrina [name] [id number] Date: 12/22/2015 Reason for consult: Follow-up assessment;Breast/nipple pain Assisted Mom with positioning and using breast compression to obtain more depth with latch. Mom has pain with initial latch that lasts few minutes then improves. Baby BF on right breast this feeding, nipple was round when baby came off the breast, no bleeding observed after 20 minute feeding. Beginning of superficial cracking noted at base of nipple upper area between 10-2. Stressed importance to Mom of depth with latch to decrease nipple pain and milk transfer. Mom not keeping baby close with feedings. Reviewed postioning with newborn v/s older babies. Care for sore nipples reviewed, Advised apply EBM, use comfort gels. Baby had been on single photo therapy which was d/c this am. Mom has been BR/BO feeding. Per chart review, baby has been to breast 6 times in 24 hours on average 15-25 minutes. Mom supplemented, usually after a BF 4 times in past 24 hours 10-60 ml. LC advised Mom baby should be at the breast at least 8 -12 times in 24 hours to encourage milk production, prevent engorgement and protect milk supply. Output adequate for age.  Engorgement care reviewed and hand pump demonstrated to use to pre-pump/post pump if needed due to engorgement. Advised of OP services and support group. Encouraged to call for questions/concerns.   Maternal Data    Feeding Feeding Type: Breast Fed Length of feed: 20 min  LATCH Score/Interventions Latch: Grasps breast easily, tongue down, lips flanged, rhythmical sucking. Intervention(s): Adjust position;Assist with latch;Breast massage;Breast compression  Audible Swallowing: A few with stimulation Intervention(s): Hand expression  Type of Nipple: Everted at rest and after stimulation  Comfort (Breast/Nipple): Filling, red/small blisters or bruises, mild/mod  discomfort  Problem noted: Mild/Moderate discomfort Interventions (Mild/moderate discomfort): Hand massage;Hand expression;Comfort gels (Advised EBM to sore nipples w/comfort gels)  Hold (Positioning): Assistance needed to correctly position infant at breast and maintain latch. Intervention(s): Breastfeeding basics reviewed;Support Pillows;Position options;Skin to skin  LATCH Score: 7  Lactation Tools Discussed/Used Tools: Pump;Comfort gels Breast pump type: Manual   Consult Status Consult Status: Complete Date: 12/22/15 Follow-up type: In-patient    Katrine Coho 12/22/2015, 10:42 AM

## 2015-12-22 NOTE — Progress Notes (Signed)
Post Partum Day 2 Subjective: no complaints, up ad lib, voiding, tolerating PO and nl lochia, pain controlled  Objective: Blood pressure 137/74, pulse 83, temperature 98.7 F (37.1 C), temperature source Oral, resp. rate 16, height 5\' 5"  (1.651 m), weight 79.833 kg (176 lb), last menstrual period 03/18/2015, SpO2 99 %, unknown if currently breastfeeding.  Physical Exam:  General: alert and no distress Lochia: appropriate Uterine Fundus: firm   Recent Labs  12/19/15 1500 12/20/15 0541  HGB 8.8* 8.2*  HCT 28.8* 26.3*    Assessment/Plan: Discharge home, Breastfeeding and Lactation consult.  D/c with motrin and PNV.  F/u 6 weeks   LOS: 3 days   Bovard-Stuckert, Sabrina [name] 12/22/2015, 8:49 AM

## 2015-12-22 NOTE — Lactation Note (Signed)
This note was copied from a baby's chart. Lactation Consultation Note  Patient Name: Sabrina [name] [id number] Date: 12/22/2015 Reason for consult: Follow-up assessment;Breast/nipple pain Mom c/o of pain with initial latch that lasts few minutes then improves. Mom reports that she has had cracking/bleeding with 3 older children for 1st week and would like to avoid this. Baby asleep at this visit, [place] encouraged Mom to call with next feeding for assist. Care for sore nipples reviewed, advised to apply EBM, comfort gels given with instructions. [place] left phone number for Mom to call.   Maternal Data    Feeding Feeding Type: Breast Fed  LATCH Score/Interventions Latch: Grasps breast easily, tongue down, lips flanged, rhythmical sucking. Intervention(s): Adjust position;Assist with latch;Breast compression  Audible Swallowing: A few with stimulation Intervention(s): Hand expression  Type of Nipple: Everted at rest and after stimulation  Comfort (Breast/Nipple): Soft / non-tender     Hold (Positioning): Assistance needed to correctly position infant at breast and maintain latch. Intervention(s): Breastfeeding basics reviewed;Support Pillows;Position options  LATCH Score: 8  Lactation Tools Discussed/Used Tools: Comfort gels   Consult Status Consult Status: Follow-up Date: 12/22/15 Follow-up type: In-patient    Katrine Coho 12/22/2015, 9:37 AM

## 2017-08-16 ENCOUNTER — Ambulatory Visit (HOSPITAL_COMMUNITY)
Admission: EM | Admit: 2017-08-16 | Discharge: 2017-08-16 | Disposition: A | Discharge status: Home / Self Care | Payer: Managed Care, Other (non HMO) | Attending: Emergency Medicine | Admitting: Emergency Medicine

## 2017-08-16 ENCOUNTER — Encounter (HOSPITAL_COMMUNITY): Payer: Self-pay | Admitting: *Deleted

## 2017-08-16 DIAGNOSIS — J370 Chronic laryngitis: Secondary | ICD-10-CM | POA: Diagnosis present

## 2017-08-16 DIAGNOSIS — J029 Acute pharyngitis, unspecified: Secondary | ICD-10-CM

## 2017-08-16 LAB — POCT RAPID STREP A: STREPTOCOCCUS, GROUP A SCREEN (DIRECT): NEGATIVE

## 2017-08-16 NOTE — Discharge Instructions (Addendum)
Advised pt she will need to get established with PCP for chronic management of medical needs, also needs to Follow up with ENT for further management of vocal chord nodule. Discussed extensively with pt UC services and unable to provide long term release from school/work note, will need to get established with PCP.

## 2017-08-16 NOTE — ED Provider Notes (Signed)
[place]    CSN: [id number] Arrival date & time: 08/16/17  1127     History   Chief Complaint Chief Complaint  Patient presents with  . Sore Throat    HPI Sabrina [name] is a 41 y.o. female.   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 Pt states she was advised to "rest" has no insurance for follow up and is currently in program trying to learn trade. Pt requesting note to excuse her from program for several weeks so she can rest. Pt denies fever or additional complaints.    The history is provided by the patient. No language interpreter was used.    Past Medical History:  Diagnosis Date  . AMA (advanced maternal age) multigravida 46+   . Anemia   . Fibroid   . Hx of gestational diabetes in prior pregnancy, currently pregnant   . No pertinent past medical history   . Normal pregnancy in multigravida in third trimester 12/18/2015  . SVD (spontaneous vaginal delivery) 12/20/2015    Patient Active Problem List   Diagnosis Date Noted  . Laryngitis, chronic 08/16/2017  . SVD (spontaneous vaginal delivery) 12/20/2015  . Other normal pregnancy, not first 12/19/2015  . Normal pregnancy in multigravida in third trimester 12/18/2015  . Acute appendicitis 05/29/2014  . Appendicitis, acute 05/28/2014    Past Surgical History:  Procedure Laterality Date  . APPENDECTOMY    . LAPAROSCOPIC APPENDECTOMY N/A 05/28/2014   Procedure: APPENDECTOMY LAPAROSCOPIC;  Surgeon: Edward Jolly, MD;  Location: [facility];  Service: General;  Laterality: N/A;  . NO PAST SURGERIES      OB History    Gravida Para Term Preterm AB Living   6 5 5  0 1 5   SAB TAB Ectopic Multiple Live Births   1 0 0 0 5       Home Medications    Prior to Admission medications   Medication Sig Start Date End Date Taking? Authorizing Provider  acetaminophen (TYLENOL) 325 MG tablet Take 2 tablets (650 mg total) by  mouth every 6 (six) hours as needed for mild pain or headache (Do not take more than 4000 mg of Tylenol (acetaminophen) per day.  it is in your prescription pain medicine.). 05/30/14   Earnstine Regal, PA-C  ibuprofen (ADVIL,MOTRIN) 800 MG tablet Take 1 tablet (800 mg total) by mouth every 8 (eight) hours as needed for moderate pain. 12/22/15   Bovard-Stuckert, Jeral Fruit, MD  Prenatal Vit-Fe Fumarate-FA (PRENATAL MULTIVITAMIN) TABS tablet Take 1 tablet by mouth daily at 12 noon. 12/22/15   Bovard-StuckertJeral Fruit, MD    Family History Family History  Problem Relation Age of Onset  . Hypertension Mother   . Hypertension Father   . Heart attack Father   . Hypertension Sister   . Depression Maternal Grandmother   . Depression Maternal Grandfather   . Depression Paternal Grandmother   . Depression Paternal Grandfather     Social History Social History  Substance Use Topics  . Smoking status: Never Smoker  . Smokeless tobacco: Never Used  . Alcohol use No     Allergies   Patient has no known allergies.   Review of Systems Review of Systems  Constitutional: Negative for fever.  HENT: Positive for sore throat and voice change.   All other systems reviewed and are negative.    Physical Exam Triage Vital Signs ED Triage  Vitals  Enc Vitals Group     BP 08/16/17 1145 112/81     Pulse Rate 08/16/17 1145 86     Resp 08/16/17 1145 16     Temp 08/16/17 1145 98.2 F (36.8 C)     Temp Source 08/16/17 1145 Oral     SpO2 08/16/17 1145 98 %     Weight 08/16/17 1146 152 lb 1.9 oz (69 kg)     Height 08/16/17 1146 5' 3.78" (1.62 m)     Head Circumference --      Peak Flow --      Pain Score --      Pain Loc --      Pain Edu? --      Excl. in [place]? --    No data found.   Updated Vital Signs BP 112/81 (BP Location: Right Arm)   Pulse 86   Temp 98.2 F (36.8 C) (Oral)   Resp 16   Ht 5' 3.78" (1.62 m)   Wt 152 lb 1.9 oz (69 kg)   SpO2 98%   BMI 26.29 kg/m   Visual  Acuity  Physical Exam  Constitutional: She appears well-developed and well-nourished. She is active and cooperative. No distress.  HENT:  Head: Normocephalic and atraumatic.  Eyes: Conjunctivae are normal.  Neck: Trachea normal. Neck supple.  Cardiovascular: Normal rate, regular rhythm and normal pulses.   No murmur heard. Pulmonary/Chest: Effort normal and breath sounds normal. No respiratory distress.  Abdominal: Soft. There is no tenderness.  Musculoskeletal: She exhibits no edema.  Neurological: She is alert. GCS eye subscore is 4. GCS verbal subscore is 5. GCS motor subscore is 6.  Skin: Skin is warm and dry.  Psychiatric: She has a normal mood and affect. Her speech is normal.  Nursing note and vitals reviewed.    UC Treatments / Results  Labs (all labs ordered are listed, but only abnormal results are displayed) Labs Reviewed  CULTURE, GROUP A STREP [facility])  POCT RAPID STREP A    EKG  EKG Interpretation None       Radiology No results found.  Procedures Procedures (including critical care time)  Medications Ordered in UC Medications - No data to display   Initial Impression / Assessment and Plan / UC Course  I have reviewed the triage vital signs and the nursing notes.  Pertinent labs & imaging results that were available during my care of the patient were reviewed by me and considered in my medical decision making (see chart for details).      Advised pt she will need to get established with PCP for chronic management of medical needs, also needs to Follow up with ENT for further management of vocal chord nodule. Discussed extensively with pt UC services and unable to provide long term release from school/work note, will need to get established with PCP.   Final Clinical Impressions(s) / UC Diagnoses   Final diagnoses:  Laryngitis, chronic    New Prescriptions Discharge Medication List as of 08/16/2017  1:03 PM       Controlled Substance  Prescriptions    Agueda Houpt, Jeanett Schlein, NP 41/93/79 1405

## 2017-08-16 NOTE — ED Triage Notes (Addendum)
Patient reports decreased voice for several weeks after the weather changed. Denies nasal drainage. Patient states she saw PCP and he told her that she needed to rest and was given to antibiotic. Patient with small child at bedside.

## 2017-08-19 LAB — CULTURE, GROUP A STREP (THRC)

## 2017-08-22 ENCOUNTER — Emergency Department (HOSPITAL_COMMUNITY): Payer: Self-pay

## 2017-08-22 ENCOUNTER — Encounter (HOSPITAL_COMMUNITY): Payer: Self-pay

## 2017-08-22 ENCOUNTER — Emergency Department (HOSPITAL_COMMUNITY)
Admission: EM | Admit: 2017-08-22 | Discharge: 2017-08-22 | Disposition: A | Discharge status: Home / Self Care | Payer: Self-pay | Attending: Emergency Medicine | Admitting: Emergency Medicine

## 2017-08-22 DIAGNOSIS — R490 Dysphonia: Secondary | ICD-10-CM

## 2017-08-22 DIAGNOSIS — K2100 Gastro-esophageal reflux disease with esophagitis, without bleeding: Secondary | ICD-10-CM

## 2017-08-22 LAB — COMPREHENSIVE METABOLIC PANEL
ALBUMIN: 3.9 g/dL (ref 3.5–5.0)
ALK PHOS: 62 U/L (ref 38–126)
ALT: 12 U/L — ABNORMAL LOW (ref 14–54)
AST: 17 U/L (ref 15–41)
Anion gap: 9 (ref 5–15)
BILIRUBIN TOTAL: 0.7 mg/dL (ref 0.3–1.2)
BUN: 8 mg/dL (ref 6–20)
CO2: 24 mmol/L (ref 22–32)
Calcium: 8.8 mg/dL — ABNORMAL LOW (ref 8.9–10.3)
Chloride: 106 mmol/L (ref 101–111)
Creatinine, Ser: 0.49 mg/dL (ref 0.44–1.00)
GFR calc Af Amer: >60 mL/min (ref >60)
GFR calc non Af Amer: >60 mL/min (ref >60)
Glucose, Bld: 100 mg/dL — ABNORMAL HIGH (ref 65–99)
POTASSIUM: 3.6 mmol/L (ref 3.5–5.1)
Sodium: 139 mmol/L (ref 135–145)
Total Protein: 7.1 g/dL (ref 6.5–8.1)

## 2017-08-22 LAB — CBC
HCT: 36.5 % (ref 36.0–46.0)
Hemoglobin: 11.9 g/dL — ABNORMAL LOW (ref 12.0–15.0)
MCH: 26.9 pg (ref 26.0–34.0)
MCHC: 32.6 g/dL (ref 30.0–36.0)
MCV: 82.6 fL (ref 78.0–100.0)
PLATELETS: 176 10*3/uL (ref 150–400)
RBC: 4.42 MIL/uL (ref 3.87–5.11)
RDW: 13.8 % (ref 11.5–15.5)
WBC: 5.4 10*3/uL (ref 4.0–10.5)

## 2017-08-22 LAB — URINALYSIS, ROUTINE W REFLEX MICROSCOPIC
Bilirubin Urine: NEGATIVE
GLUCOSE, UA: NEGATIVE mg/dL
HGB URINE DIPSTICK: NEGATIVE
KETONES UR: NEGATIVE mg/dL
Leukocytes, UA: NEGATIVE
Nitrite: NEGATIVE
PROTEIN: NEGATIVE mg/dL
Specific Gravity, Urine: 1.01 (ref 1.005–1.030)
pH: 7 (ref 5.0–8.0)

## 2017-08-22 LAB — I-STAT BETA HCG BLOOD, ED (MC, WL, AP ONLY)

## 2017-08-22 LAB — LIPASE, BLOOD: Lipase: 28 U/L (ref 11–51)

## 2017-08-22 MED ORDER — SODIUM CHLORIDE 0.9 % IV BOLUS (SEPSIS)
1000.0000 mL | Freq: Once | INTRAVENOUS | Status: AC
  Administered 2017-08-22: 1000 mL via INTRAVENOUS

## 2017-08-22 MED ORDER — PANTOPRAZOLE SODIUM 20 MG PO TBEC: 20.0000 mg | DELAYED_RELEASE_TABLET | Freq: Two times a day (BID) | ORAL | 0 refills | Status: DC

## 2017-08-22 MED ORDER — DEXAMETHASONE SODIUM PHOSPHATE 10 MG/ML IJ SOLN
10.0000 mg | Freq: Once | INTRAMUSCULAR | Status: AC
  Administered 2017-08-22: 10 mg via INTRAVENOUS
  Filled 2017-08-22: qty 1

## 2017-08-22 MED ORDER — IOPAMIDOL (ISOVUE-300) INJECTION 61%
INTRAVENOUS | Status: AC
  Filled 2017-08-22: qty 100

## 2017-08-22 MED ORDER — METHYLPREDNISOLONE 4 MG PO TBPK: ORAL_TABLET | ORAL | 0 refills | Status: DC

## 2017-08-22 MED ORDER — ONDANSETRON HCL 4 MG/2ML IJ SOLN
4.0000 mg | Freq: Once | INTRAMUSCULAR | Status: AC
  Administered 2017-08-22: 4 mg via INTRAVENOUS
  Filled 2017-08-22: qty 2

## 2017-08-22 MED ORDER — IOPAMIDOL (ISOVUE-300) INJECTION 61%
75.0000 mL | Freq: Once | INTRAVENOUS | Status: AC | PRN
  Administered 2017-08-22: 75 mL via INTRAVENOUS

## 2017-08-22 MED ORDER — GI COCKTAIL ~~LOC~~
30.0000 mL | Freq: Once | ORAL | Status: AC
  Administered 2017-08-22: 30 mL via ORAL
  Filled 2017-08-22: qty 30

## 2017-08-22 NOTE — ED Notes (Signed)
ED Provider at bedside. 

## 2017-08-22 NOTE — Discharge Instructions (Signed)
It is very important to take the medications as prescribed  Follow-up with an ENT physician in the next week. Hoarseness can be potentially very serious and signs of infection, cancer, or other significant pathology.  If you develop worsening shortness of breath or difficulty swallowing, present to the ER immediately

## 2017-08-22 NOTE — ED Notes (Signed)
Dee NT instructed patient to get changed into gown.  Patient stating that mainly her throat and abd only hurts when she talks.

## 2017-08-22 NOTE — ED Provider Notes (Signed)
[facility] DEPT Provider Note   CSN: [id number] Arrival date & time: 08/22/17  2094     History   Chief Complaint Chief Complaint  Patient presents with  . Sore Throat  . Abdominal Pain  . Nausea    HPI Sabrina [name] is a 41 y.o. female.  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  The patient states that she has lost her voice for the last 7 months.  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  Over the last 7 months, her voice has gotten progressively more hoarse and weak.  She has also had difficulty swallowing as well as associated epigastric and neck pain.  She has been seen in urgent care and told that they could not figure out what was wrong.  She denies any weight loss or weight changes.  She denies any smoking history.  No secondhand smoke.  Denies any chemical exposures.  No other areas of pain.  She does have occasional nausea and vomiting with her epigastric pain, but this is not currently bothering her.  Past Medical History:  Diagnosis Date  . AMA (advanced maternal age) multigravida 26+   . Anemia   . Fibroid   . Hx of gestational diabetes in prior pregnancy, currently pregnant   . No pertinent past medical history   . Normal pregnancy in multigravida in third trimester 12/18/2015  . SVD (spontaneous vaginal delivery) 12/20/2015    Patient Active Problem List   Diagnosis Date Noted  . Laryngitis, chronic 08/16/2017  . SVD (spontaneous vaginal delivery) 12/20/2015  . Other normal pregnancy, not first 12/19/2015  . Normal pregnancy in multigravida in third trimester 12/18/2015  . Acute appendicitis 05/29/2014  . Appendicitis, acute 05/28/2014    Past Surgical History:  Procedure Laterality Date  . APPENDECTOMY    . LAPAROSCOPIC APPENDECTOMY N/A 05/28/2014   Procedure: APPENDECTOMY LAPAROSCOPIC;   Surgeon: Edward Jolly, MD;  Location: [facility];  Service: General;  Laterality: N/A;  . NO PAST SURGERIES      OB History    Gravida Para Term Preterm AB Living   6 5 5  0 1 5   SAB TAB Ectopic Multiple Live Births   1 0 0 0 5       Home Medications    Prior to Admission medications   Medication Sig Start Date End Date Taking? Authorizing Provider  methylPREDNISolone (MEDROL DOSEPAK) 4 MG TBPK tablet Take as directed on the package 08/22/17   Duffy Bruce, MD  pantoprazole (PROTONIX) 20 MG tablet Take 1 tablet (20 mg total) by mouth 2 (two) times daily. 08/22/17 09/05/17  Duffy Bruce, MD    Family History Family History  Problem Relation Age of Onset  . Hypertension Mother   . Hypertension Father   . Heart attack Father   . Hypertension Sister   . Depression Maternal Grandmother   . Depression Maternal Grandfather   . Depression Paternal Grandmother   . Depression Paternal Grandfather     Social History Social History  Substance Use Topics  . Smoking status: Never Smoker  . Smokeless tobacco: Never Used  . Alcohol use No     Allergies   Patient has no known allergies.   Review of Systems Review of Systems  Constitutional: Positive for fatigue.  HENT: Positive for trouble swallowing and voice  change.   Gastrointestinal: Positive for abdominal pain, nausea and vomiting.  All other systems reviewed and are negative.    Physical Exam Updated Vital Signs BP 124/79 (BP Location: Left Arm)   Pulse 93   Temp 99.3 F (37.4 C) (Oral)   Resp 15   LMP 08/01/2017   SpO2 100%   Physical Exam  Constitutional: She is oriented to person, place, and time. She appears well-developed and well-nourished. No distress.  HENT:  Head: Normocephalic and atraumatic.  Markedly hoarse voice.  No crepitance or tenderness to the anterior neck.  No carotid bruit.  Full range of motion.  No appreciable masses or tracheal deviation.  Eyes: Conjunctivae are normal.    Neck: Normal range of motion. Neck supple.  Cardiovascular: Normal rate, regular rhythm and normal heart sounds.  Exam reveals no friction rub.   No murmur heard. Pulmonary/Chest: Effort normal and breath sounds normal. No stridor. No respiratory distress. She has no wheezes. She has no rales.  Abdominal: She exhibits no distension.  Musculoskeletal: She exhibits no edema.  Neurological: She is alert and oriented to person, place, and time. She exhibits normal muscle tone.  Skin: Skin is warm. Capillary refill takes less than 2 seconds.  Psychiatric: She has a normal mood and affect.  Nursing note and vitals reviewed.    ED Treatments / Results  Labs (all labs ordered are listed, but only abnormal results are displayed) Labs Reviewed  COMPREHENSIVE METABOLIC PANEL - Abnormal; Notable for the following:       Result Value   Glucose, Bld 100 (*)    Calcium 8.8 (*)    ALT 12 (*)    All other components within normal limits  CBC - Abnormal; Notable for the following:    Hemoglobin 11.9 (*)    All other components within normal limits  LIPASE, BLOOD  URINALYSIS, ROUTINE W REFLEX MICROSCOPIC  I-STAT BETA HCG BLOOD, ED ([facility], [facility], [facility] ONLY)    EKG  EKG Interpretation None       Radiology Ct Soft Tissue Neck W Contrast  Result Date: 08/22/2017 CLINICAL DATA:  Chronic laryngitis EXAM: CT NECK WITH CONTRAST TECHNIQUE: Multidetector CT imaging of the neck was performed using the standard protocol following the bolus administration of intravenous contrast. CONTRAST:  32mL ISOVUE-300 IOPAMIDOL (ISOVUE-300) INJECTION 61% COMPARISON:  None. FINDINGS: Pharynx and larynx: Normal pharynx. No mass in the tonsil or tongue. Epiglottis and larynx normal. Vocal cords normal. No evidence of mass or paralysis. Salivary glands: No inflammation, mass, or stone. Thyroid: Negative Lymph nodes: No pathologic lymph nodes in the neck. Subcentimeter submandibular lymph nodes bilaterally. Vascular: Negative  Limited intracranial: Negative Visualized orbits: Negative Mastoids and visualized paranasal sinuses: Mild mucosal thickening base of left maxillary sinus, otherwise negative Skeleton: Negative Upper chest: Negative Other: None IMPRESSION: Negative CT neck.  Negative for mass or adenopathy.  Normal larynx. Electronically Signed   By: Franchot Gallo M.D.   On: 08/22/2017 14:20    Procedures Procedures (including critical care time)  Medications Ordered in ED Medications  dexamethasone (DECADRON) injection 10 mg (10 mg Intravenous Given 08/22/17 1209)  sodium chloride 0.9 % bolus 1,000 mL (0 mLs Intravenous Stopped 08/22/17 1258)  gi cocktail (Maalox,Lidocaine,Donnatal) (30 mLs Oral Given 08/22/17 1259)  ondansetron (ZOFRAN) injection 4 mg (4 mg Intravenous Given 08/22/17 1300)  iopamidol (ISOVUE-300) 61 % injection 75 mL (75 mLs Intravenous Contrast Given 08/22/17 1401)     Initial Impression / Assessment and Plan / ED Course  I have reviewed the triage vital signs and the nursing notes.  Pertinent labs & imaging results that were available during my care of the patient were reviewed by me and considered in my medical decision making (see chart for details).     41 yo F with PMHx as above here with persistent hoarseness x 7 months. 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 However, must consider vocal cord tumor, neck mass. Will check CT. Pt is o/w well appearing, afebrile. WBC normal. No fevers. I do not suspect infectious etiology such as epiglottitis or RPA at this time. She has fully supple neck. No signs of vascular abnormality of the neck. No palpable goiter. She is o/w immunocompetent and well appearing. Will f/u CT.  CT imaging neg. Discussed case with Dr. Wilburn Cornelia of ENT. Will place pt on PPI BID, Medrol dosepak and d/c with close outpt follow-up. Discussed my concerns, ddx, and strict return precautions in  detail.  Final Clinical Impressions(s) / ED Diagnoses   Final diagnoses:  Hoarseness  Gastroesophageal reflux disease with esophagitis    New Prescriptions Discharge Medication List as of 08/22/2017  3:38 PM    START taking these medications   Details  methylPREDNISolone (MEDROL DOSEPAK) 4 MG TBPK tablet Take as directed on the package, Print    pantoprazole (PROTONIX) 20 MG tablet Take 1 tablet (20 mg total) by mouth 2 (two) times daily., Starting Mon 08/22/2017, Until Mon 09/05/2017, Print         Duffy Bruce, MD 08/22/17 2102

## 2017-08-22 NOTE — ED Triage Notes (Addendum)
Pt c/o increasing sore throat and hoarseness x 7 months and upper abdominal pain and nausea starting last night.  Pain score 8/10.  Pt has been seen several times for sore throat and sts "they did tell me anything ."   Pt had negative strep test on 10/23.

## 2019-01-20 ENCOUNTER — Encounter (HOSPITAL_COMMUNITY): Payer: Self-pay

## 2019-01-20 ENCOUNTER — Other Ambulatory Visit: Payer: Self-pay

## 2019-01-20 ENCOUNTER — Emergency Department (HOSPITAL_COMMUNITY)
Admission: EM | Admit: 2019-01-20 | Discharge: 2019-01-20 | Disposition: A | Discharge status: Home / Self Care | Payer: Self-pay | Attending: Emergency Medicine | Admitting: Emergency Medicine

## 2019-01-20 DIAGNOSIS — K0889 Other specified disorders of teeth and supporting structures: Secondary | ICD-10-CM | POA: Insufficient documentation

## 2019-01-20 MED ORDER — PENICILLIN V POTASSIUM 500 MG PO TABS: 500.0000 mg | ORAL_TABLET | Freq: Four times a day (QID) | ORAL | 0 refills | Status: AC

## 2019-01-20 MED ORDER — LIDOCAINE VISCOUS HCL 2 % MT SOLN: 15.0000 mL | OROMUCOSAL | 0 refills | Status: DC | PRN

## 2019-01-20 MED ORDER — NYSTATIN 100000 UNIT/ML MT SUSP: 500000.0000 [IU] | Freq: Four times a day (QID) | OROMUCOSAL | 0 refills | Status: DC

## 2019-01-20 NOTE — ED Provider Notes (Signed)
[facility] DEPT Provider Note   CSN: [id number] Arrival date & time: 01/20/19  1827    History   Chief Complaint Chief Complaint  Patient presents with  . Dental Pain    HPI Sabrina [name] is a 43 y.o. female presenting for evaluation of dental pain.  Patient states 2 weeks ago she had a procedure done on her left lower teeth.  She does not know what the procedure was, but it was not an extraction.  She was told to expect increased pain several days after the procedure, but it should go away soon after that.  Patient reports constant pain for the past 2 weeks, worsening over the past 3 days.  She has been unable to sleep due to the pain.  Patient has been taking ibuprofen as prescribed without improvement of her symptoms.  She has not tried anything else.  She was not given any antibiotics.  She called her dentist, but they were unable to see her as the office is closed for the next month due to coronavirus.  She denies fevers, chills, trismus, difficulty swallowing.  She has no other medical problems, takes no medications daily.     HPI  Past Medical History:  Diagnosis Date  . AMA (advanced maternal age) multigravida 55+   . Anemia   . Fibroid   . Hx of gestational diabetes in prior pregnancy, currently pregnant   . No pertinent past medical history   . Normal pregnancy in multigravida in third trimester 12/18/2015  . SVD (spontaneous vaginal delivery) 12/20/2015    Patient Active Problem List   Diagnosis Date Noted  . Laryngitis, chronic 08/16/2017  . SVD (spontaneous vaginal delivery) 12/20/2015  . Other normal pregnancy, not first 12/19/2015  . Normal pregnancy in multigravida in third trimester 12/18/2015  . Acute appendicitis 05/29/2014  . Appendicitis, acute 05/28/2014    Past Surgical History:  Procedure Laterality Date  . APPENDECTOMY    . LAPAROSCOPIC APPENDECTOMY N/A 05/28/2014   Procedure: APPENDECTOMY LAPAROSCOPIC;  Surgeon:  Edward Jolly, MD;  Location: [facility];  Service: General;  Laterality: N/A;  . NO PAST SURGERIES       OB History    Gravida  6   Para  5   Term  5   Preterm  0   AB  1   Living  5     SAB  1   TAB  0   Ectopic  0   Multiple  0   Live Births  5            Home Medications    Prior to Admission medications   Medication Sig Start Date End Date Taking? Authorizing Provider  lidocaine (XYLOCAINE) 2 % solution Use as directed 15 mLs in the mouth or throat as needed for mouth pain. 01/20/19   Ayanna Gheen, PA-C  methylPREDNISolone (MEDROL DOSEPAK) 4 MG TBPK tablet Take as directed on the package 08/22/17   Duffy Bruce, MD  nystatin (MYCOSTATIN) 100000 UNIT/ML suspension Take 5 mLs (500,000 Units total) by mouth 4 (four) times daily. 01/20/19   Lorita Forinash, PA-C  pantoprazole (PROTONIX) 20 MG tablet Take 1 tablet (20 mg total) by mouth 2 (two) times daily. 08/22/17 09/05/17  Duffy Bruce, MD  penicillin v potassium (VEETID) 500 MG tablet Take 1 tablet (500 mg total) by mouth 4 (four) times daily for 7 days. 01/20/19 01/27/19  Franchot Heidelberg, PA-C    Family History Family History  Problem Relation  Age of Onset  . Hypertension Mother   . Hypertension Father   . Heart attack Father   . Hypertension Sister   . Depression Maternal Grandmother   . Depression Maternal Grandfather   . Depression Paternal Grandmother   . Depression Paternal Grandfather     Social History Social History   Tobacco Use  . Smoking status: Never Smoker  . Smokeless tobacco: Never Used  Substance Use Topics  . Alcohol use: No  . Drug use: No     Allergies   Patient has no known allergies.   Review of Systems Review of Systems  Constitutional: Negative for fever.  HENT: Positive for dental problem.      Physical Exam Updated Vital Signs BP (!) 141/97 (BP Location: Left Arm)   Pulse 86   Temp 98.4 F (36.9 C) (Oral)   Resp 18   LMP 12/29/2018    SpO2 100%   Physical Exam Vitals signs and nursing note reviewed.  Constitutional:      General: She is not in acute distress.    Appearance: She is well-developed.  HENT:     Head: Normocephalic and atraumatic.     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  Erythema surrounding the leukoplakia.  No obvious abscess.  No trismus.  No pain, leukoplakia, or erythema noted elsewhere in the mouth. Neck:     Musculoskeletal: Normal range of motion.  Pulmonary:     Effort: Pulmonary effort is normal.  Abdominal:     General: There is no distension.  Musculoskeletal: Normal range of motion.  Skin:    General: Skin is warm.     Findings: No rash.  Neurological:     Mental Status: She is alert and oriented to person, place, and time.      ED Treatments / Results  Labs (all labs ordered are listed, but only abnormal results are displayed) Labs Reviewed - No data to display  EKG None  Radiology No results found.  Procedures Procedures (including critical care time)  Medications Ordered in ED Medications - No data to display   Initial Impression / Assessment and Plan / ED Course  I have reviewed the triage vital signs and the nursing notes.  Pertinent labs & imaging results that were available during my care of the patient were reviewed by me and considered in my medical decision making (see chart for details).        Patient presenting for evaluation of dental/mouth pain.  Physical exam reassuring, she is afebrile not tachycardic.  Appears nontoxic.  Oral exam concerning due to erythema, pain, and completely.  As such, consider oral candidiasis.  However, much more likely after dental procedure is a bacterial infection.  As such, will cover with both penicillin and nystatin pension.  Encourage continue ibuprofen for pain control, this is lidocaine for further pain control.  Encourage follow-up with PCP as needed, as her dentist  will not see her.  At this time, patient appears safe for discharge.  Return precautions given.  Patient states she understands and agrees to plan.  Final Clinical Impressions(s) / ED Diagnoses   Final diagnoses:  Pain, dental    ED Discharge Orders         Ordered    penicillin v potassium (VEETID) 500 MG tablet  4 times daily     01/20/19 1852    nystatin (MYCOSTATIN) 100000 UNIT/ML suspension  4 times daily  01/20/19 1852    lidocaine (XYLOCAINE) 2 % solution  As needed     01/20/19 1852           Franchot Heidelberg, PA-C 01/20/19 1908    Lacretia Leigh, MD 01/23/19 1407

## 2019-01-20 NOTE — Discharge Instructions (Signed)
Take antibiotics as prescribed.  Take the entire course, even if your symptoms improve.  Take the pill as well as the suspension 4 times a day. Use the viscous lidocaine as needed for pain.  This is the numbing medicine. Continue taking ibuprofen help with pain and swelling. Follow-up with your primary care doctor if your symptoms are improving. Return to the emergency room with any new, worsening, concerning symptoms.

## 2019-01-20 NOTE — ED Triage Notes (Signed)
Patient c/o dental pain on left side that started last Sunday.   9/10 throbbing pain   Patient states she has recently been seen for same.   Patient states she has been unable to sleep due to pain X3 days.    Ambulatory in triage.

## 2021-10-28 ENCOUNTER — Emergency Department (HOSPITAL_BASED_OUTPATIENT_CLINIC_OR_DEPARTMENT_OTHER): Payer: 59 | Admitting: Radiology

## 2021-10-28 ENCOUNTER — Emergency Department (HOSPITAL_BASED_OUTPATIENT_CLINIC_OR_DEPARTMENT_OTHER)
Admission: EM | Admit: 2021-10-28 | Discharge: 2021-10-28 | Disposition: A | Discharge status: Home / Self Care | Payer: 59 | Attending: Emergency Medicine | Admitting: Emergency Medicine

## 2021-10-28 ENCOUNTER — Other Ambulatory Visit: Payer: Self-pay

## 2021-10-28 ENCOUNTER — Encounter (HOSPITAL_BASED_OUTPATIENT_CLINIC_OR_DEPARTMENT_OTHER): Payer: Self-pay | Admitting: Emergency Medicine

## 2021-10-28 DIAGNOSIS — R002 Palpitations: Secondary | ICD-10-CM | POA: Insufficient documentation

## 2021-10-28 DIAGNOSIS — R079 Chest pain, unspecified: Secondary | ICD-10-CM | POA: Insufficient documentation

## 2021-10-28 LAB — CBC
HCT: 36.2 % (ref 36.0–46.0)
Hemoglobin: 11.4 g/dL — ABNORMAL LOW (ref 12.0–15.0)
MCH: 26 pg (ref 26.0–34.0)
MCHC: 31.5 g/dL (ref 30.0–36.0)
MCV: 82.5 fL (ref 80.0–100.0)
Platelets: 265 10*3/uL (ref 150–400)
RBC: 4.39 MIL/uL (ref 3.87–5.11)
RDW: 14.2 % (ref 11.5–15.5)
WBC: 5.6 10*3/uL (ref 4.0–10.5)
nRBC: 0 % (ref 0.0–0.2)

## 2021-10-28 LAB — BASIC METABOLIC PANEL
Anion gap: 7 (ref 5–15)
BUN: 6 mg/dL (ref 6–20)
CO2: 28 mmol/L (ref 22–32)
Calcium: 9.4 mg/dL (ref 8.9–10.3)
Chloride: 102 mmol/L (ref 98–111)
Creatinine, Ser: 0.56 mg/dL (ref 0.44–1.00)
GFR, Estimated: >60 mL/min (ref >60)
Glucose, Bld: 98 mg/dL (ref 70–99)
Potassium: 3.9 mmol/L (ref 3.5–5.1)
Sodium: 137 mmol/L (ref 135–145)

## 2021-10-28 LAB — PREGNANCY, URINE: Preg Test, Ur: NEGATIVE

## 2021-10-28 LAB — TROPONIN I (HIGH SENSITIVITY): Troponin I (High Sensitivity): <2 ng/L (ref <18)

## 2021-10-28 NOTE — ED Provider Notes (Signed)
[name] Provider Note   CSN: [id number] Arrival date & time: 10/28/21  5102     History  Chief Complaint  Patient presents with   Chest Pain    Sabrina [name] is a 46 y.o. female with no significant past medical history who presents with chest pain, palpitations, left arm heaviness since last night.  Patient reports that she is also had slight decrease in appetite since around dinner last night.  Patient reports that she has a strong family history of acute coronary syndrome, with mother dying of a myocardial infarction at age 73.  Patient reports that her chest pain is not associated with exertion, she denies shortness of breath, nausea, vomiting, diaphoresis.  Patient reports that she has been seen for this problem in the past, and her previous practitioner had said that it was likely related to anxiety, and had given her medication for anxiety which she said that resolved with symptoms.  Patient does report that she has had persistent feeling of heart racing however, and has not had any evaluation by cardiologist in the past.  No history of diabetes, tobacco smoking, previous ACS, stroke.   Chest Pain Associated symptoms: palpitations       Home Medications Prior to Admission medications   Medication Sig Start Date End Date Taking? Authorizing Provider  lidocaine (XYLOCAINE) 2 % solution Use as directed 15 mLs in the mouth or throat as needed for mouth pain. 01/20/19   Caccavale, Sophia, PA-C  methylPREDNISolone (MEDROL DOSEPAK) 4 MG TBPK tablet Take as directed on the package 08/22/17   Duffy Bruce, MD  nystatin (MYCOSTATIN) 100000 UNIT/ML suspension Take 5 mLs (500,000 Units total) by mouth 4 (four) times daily. 01/20/19   Caccavale, Sophia, PA-C  pantoprazole (PROTONIX) 20 MG tablet Take 1 tablet (20 mg total) by mouth 2 (two) times daily. 08/22/17 09/05/17  Duffy Bruce, MD      Allergies    Patient has no known allergies.    Review of  Systems   Review of Systems  Cardiovascular:  Positive for chest pain and palpitations.  All other systems reviewed and are negative.  Physical Exam Updated Vital Signs BP (!) 124/103    Pulse 76    Temp 99.1 F (37.3 C)    Resp 18    Ht 5\' 8"  (1.727 m)    Wt 73 kg    LMP 10/21/2021 (Exact Date)    SpO2 100%    BMI 24.47 kg/m  Physical Exam Vitals and nursing note reviewed.  Constitutional:      General: She is not in acute distress.    Appearance: Normal appearance.     Comments: Patient does have a somewhat anxious affect, but is otherwise well-appearing, not diaphoretic, laying in bed in no acute distress  HENT:     Head: Normocephalic and atraumatic.  Eyes:     General:        Right eye: No discharge.        Left eye: No discharge.  Cardiovascular:     Rate and Rhythm: Normal rate and regular rhythm.     Heart sounds: No murmur heard.   No friction rub. No gallop.     Comments: Normal heart rate, rhythm, patient does have some slight tenderness to palpation of the chest wall, and the central sternum Pulmonary:     Effort: Pulmonary effort is normal.     Breath sounds: Normal breath sounds.  Abdominal:     General: Bowel sounds  are normal.     Palpations: Abdomen is soft.  Musculoskeletal:     Comments: Intact strength 5 out of 5 bilateral upper extremities  Skin:    General: Skin is warm and dry.     Capillary Refill: Capillary refill takes less than 2 seconds.  Neurological:     Mental Status: She is alert and oriented to person, place, and time.  Psychiatric:        Mood and Affect: Mood normal.        Behavior: Behavior normal.    ED Results / Procedures / Treatments   Labs (all labs ordered are listed, but only abnormal results are displayed) Labs Reviewed  CBC - Abnormal; Notable for the following components:      Result Value   Hemoglobin 11.4 (*)    All other components within normal limits  BASIC METABOLIC PANEL  PREGNANCY, URINE  TROPONIN I (HIGH  SENSITIVITY)  TROPONIN I (HIGH SENSITIVITY)    EKG None  Radiology DG Chest 2 View  Result Date: 10/28/2021 CLINICAL DATA:  chest pain EXAM: CHEST - 2 VIEW COMPARISON:  None. FINDINGS: No consolidation. No visible pleural effusions or pneumothorax. Cardiomediastinal silhouette is within normal limits. No acute osseous abnormality. IMPRESSION: No evidence of acute cardiopulmonary disease. Electronically Signed   By: Margaretha Sheffield M.D.   On: 10/28/2021 10:43    Procedures Procedures    Medications Ordered in ED Medications - No data to display  ED Course/ Medical Decision Making/ A&P                           Medical Decision Making  Given the large differential diagnosis for Sabrina [name], the decision making in this case is of high complexity.  After evaluating all of the data points in this case, the presentation of Sabrina [name] is NOT consistent with Acute Coronary Syndrome (ACS) and/or myocardial ischemia, pulmonary embolism, aortic dissection; [name], significant arrythmia, pneumothorax, cardiac tamponade, or other emergent cardiopulmonary condition.  Further, the presentation of [name] is NOT consistent with pericarditis, myocarditis, cholecystitis, pancreatitis, mediastinitis, endocarditis, new valvular disease.  Additionally, the presentation of [name] is NOT consistent with flail chest, cardiac contusion, ARDS, or significant intra-thoracic or intra-abdominal bleeding.  Moreover, this presentation is NOT consistent with pneumonia, sepsis, or pyelonephritis.  The patient has a negative troponin x1, a nonischemic, normal EKG with normal sinus rhythm, no tachycardia, no arrhythmia.  Patient has an unremarkable chest x-ray.  She has a mild anemia at 11.4.  I do believe that there is some component of anxiety related here as patient feels very anxious about possible cardiac cause of her symptoms.  She does have a little bit of tenderness palpation over the chest  wall, and feeling of heaviness in the left arm, it is possible that she has a musculoskeletal injury.  She has a heart score of 3, and other than a strong family history does not have strong personal risk factors.  Given her persistent feeling of heart palpitations, but inability to capture any palpitations or arrhythmias while she is in the facility today I do recommend that she follow-up with cardiology for further evaluation and possible 24-hour Holter monitor versus 30-day event monitor.  Patient understands and agrees to plan, discharged in stable condition at this time.  Patient was placed on a cardiac monitor during the duration of her visit with no evidence of arrhythmia, tachycardia, or other events noted during the duration  of her stay.  Strict return and follow-up precautions have been given by me personally or by detailed written instruction given verbally by nursing staff using the teach back method to the patient/family/caregiver(s).  Data Reviewed/Counseling: I have reviewed the patient's vital signs, nursing notes, and other relevant tests/information. I had a detailed discussion regarding the historical points, exam findings, and any diagnostic results supporting the discharge diagnosis. I also discussed the need for outpatient follow-up and the need to return to the ED if symptoms worsen or if there are any questions or concerns that arise at home.  Final Clinical Impression(s) / ED Diagnoses Final diagnoses:  Chest pain, unspecified type    Rx / DC Orders ED Discharge Orders     None         Dorien Chihuahua 10/28/21 1228    Regan Lemming, MD 10/28/21 Kathyrn Drown

## 2021-10-28 NOTE — Discharge Instructions (Addendum)
As we discussed I did not see any evidence of you having a heart attack, pneumonia, or other abnormality with your heart today.  He had a normal heart rate and rhythm on your EKG, on the heart monitor, and while I was listening.  Given your persistent feeling of having a heart racing, specially with your strong family history of heart attacks I do recommend that you follow-up with cardiology for further evaluation.  I have attached the name and number of a cardiologist free to call at your earliest convenience.

## 2021-10-28 NOTE — ED Triage Notes (Addendum)
Pt reports palpitation , left arm numbness. Mother died of MI at age 48.  Denies shortness of breath ,  Left chest pain radiating to left arm

## 2022-11-26 ENCOUNTER — Ambulatory Visit: Payer: Self-pay | Admitting: Nurse Practitioner

## 2023-01-20 ENCOUNTER — Ambulatory Visit: Payer: BLUE CROSS/BLUE SHIELD | Admitting: Nurse Practitioner

## 2023-01-20 VITALS — BP 112/74 | HR 66 | Temp 98.4°F | Ht 68.0 in | Wt 172.1 lb

## 2023-01-20 DIAGNOSIS — D649 Anemia, unspecified: Secondary | ICD-10-CM | POA: Diagnosis not present

## 2023-01-20 DIAGNOSIS — R002 Palpitations: Secondary | ICD-10-CM | POA: Diagnosis not present

## 2023-01-20 DIAGNOSIS — R5383 Other fatigue: Secondary | ICD-10-CM

## 2023-01-20 DIAGNOSIS — O99019 Anemia complicating pregnancy, unspecified trimester: Secondary | ICD-10-CM | POA: Insufficient documentation

## 2023-01-20 DIAGNOSIS — O09529 Supervision of elderly multigravida, unspecified trimester: Secondary | ICD-10-CM | POA: Insufficient documentation

## 2023-01-20 DIAGNOSIS — R8290 Unspecified abnormal findings in urine: Secondary | ICD-10-CM | POA: Insufficient documentation

## 2023-01-20 DIAGNOSIS — N912 Amenorrhea, unspecified: Secondary | ICD-10-CM | POA: Insufficient documentation

## 2023-01-20 DIAGNOSIS — Z1159 Encounter for screening for other viral diseases: Secondary | ICD-10-CM | POA: Diagnosis not present

## 2023-01-20 LAB — COMPREHENSIVE METABOLIC PANEL
ALT: 22 U/L (ref 0–35)
AST: 24 U/L (ref 0–37)
Albumin: 4 g/dL (ref 3.5–5.2)
Alkaline Phosphatase: 61 U/L (ref 39–117)
BUN: 7 mg/dL (ref 6–23)
CO2: 29 mEq/L (ref 19–32)
Calcium: 9 mg/dL (ref 8.4–10.5)
Chloride: 105 mEq/L (ref 96–112)
Creatinine, Ser: 0.61 mg/dL (ref 0.40–1.20)
GFR: 105.36 mL/min (ref >60.00)
Glucose, Bld: 97 mg/dL (ref 70–99)
Potassium: 3.8 mEq/L (ref 3.5–5.1)
Sodium: 138 mEq/L (ref 135–145)
Total Bilirubin: 0.5 mg/dL (ref 0.2–1.2)
Total Protein: 6.8 g/dL (ref 6.0–8.3)

## 2023-01-20 LAB — LIPID PANEL
Cholesterol: 201 mg/dL — ABNORMAL HIGH (ref 0–200)
HDL: 56.6 mg/dL (ref >39.00)
LDL Cholesterol: 126 mg/dL — ABNORMAL HIGH (ref 0–99)
NonHDL: 144.12
Total CHOL/HDL Ratio: 4
Triglycerides: 90 mg/dL (ref 0.0–149.0)
VLDL: 18 mg/dL (ref 0.0–40.0)

## 2023-01-20 LAB — VITAMIN B12: Vitamin B-12: 561 pg/mL (ref 211–911)

## 2023-01-20 LAB — HEMOGLOBIN A1C: Hgb A1c MFr Bld: 5.3 % (ref 4.6–6.5)

## 2023-01-20 LAB — CBC
HCT: 37.9 % (ref 36.0–46.0)
Hemoglobin: 12.9 g/dL (ref 12.0–15.0)
MCHC: 34 g/dL (ref 30.0–36.0)
MCV: 84.6 fl (ref 78.0–100.0)
Platelets: 209 10*3/uL (ref 150.0–400.0)
RBC: 4.48 Mil/uL (ref 3.87–5.11)
RDW: 14.4 % (ref 11.5–15.5)
WBC: 5.8 10*3/uL (ref 4.0–10.5)

## 2023-01-20 LAB — FERRITIN: Ferritin: 11.7 ng/mL (ref 10.0–291.0)

## 2023-01-20 LAB — T4, FREE: Free T4: 0.9 ng/dL (ref 0.60–1.60)

## 2023-01-20 LAB — T3, FREE: T3, Free: 3.1 pg/mL (ref 2.3–4.2)

## 2023-01-20 LAB — TSH: TSH: 0.78 u[IU]/mL (ref 0.35–5.50)

## 2023-01-20 LAB — IRON: Iron: 85 ug/dL (ref 42–145)

## 2023-01-20 NOTE — Assessment & Plan Note (Signed)
Check cbc, ferritin, iron levels today. Further recommendations may be made based on these results.

## 2023-01-20 NOTE — Assessment & Plan Note (Signed)
Only mentioned this during ROS. Notices it only when worried. For now check labs, and further recommendations may be made based on results. Consider eval with cardiology or ambulatory cardiac monitor in the future.

## 2023-01-20 NOTE — Progress Notes (Signed)
New Patient Office Visit  Subjective    Patient ID: Sabrina [name], female    DOB: 22-Apr-1976  Age: 47 y.o. MRN: [id number]  CC:  Chief Complaint  Patient presents with   Fatigue    Having bad headache and bas fatigue  Memory     HPI Sabrina [name] presents to establish care Main concern is fatigue and memory issues. Reports she has felt fatigued and noticed hair loss with 8kg weight gain over the last 3 months. Has history of iron deficiency. Not on iron supplement currently. Feels mood is good, denies depression. Denies history of snoring or witnessed apneic episodes.  Has noticed she is more forgetful. Examples include forgetting appointments, forgetting things she needs to buy at the store, forgetting to call family back. Denies episodes of disorientation or difficulty with word finding.   Outpatient Encounter Medications as of 01/20/2023  Medication Sig   pantoprazole (PROTONIX) 20 MG tablet Take 1 tablet (20 mg total) by mouth 2 (two) times daily.   [DISCONTINUED] lidocaine (XYLOCAINE) 2 % solution Use as directed 15 mLs in the mouth or throat as needed for mouth pain.   [DISCONTINUED] methylPREDNISolone (MEDROL DOSEPAK) 4 MG TBPK tablet Take as directed on the package   [DISCONTINUED] nystatin (MYCOSTATIN) 100000 UNIT/ML suspension Take 5 mLs (500,000 Units total) by mouth 4 (four) times daily.   No facility-administered encounter medications on file as of 01/20/2023.    Past Medical History:  Diagnosis Date   AMA (advanced maternal age) multigravida 35+    Anemia    Fibroid    Hx of gestational diabetes in prior pregnancy, currently pregnant    No pertinent past medical history    Normal pregnancy in multigravida in third trimester 12/18/2015   SVD (spontaneous vaginal delivery) 12/20/2015    Past Surgical History:  Procedure Laterality Date   APPENDECTOMY     LAPAROSCOPIC APPENDECTOMY N/A 05/28/2014   Procedure: APPENDECTOMY LAPAROSCOPIC;  Surgeon: Edward Jolly, MD;   Location: [facility];  Service: General;  Laterality: N/A;   NO PAST SURGERIES      Family History  Problem Relation Age of Onset   Hypertension Mother    Hypertension Father    Heart attack Father    Hypertension Sister    Depression Maternal Grandmother    Depression Maternal Grandfather    Depression Paternal Grandmother    Depression Paternal Grandfather     Social History   Socioeconomic History   Marital status: Married    Spouse name: Not on file   Number of children: Not on file   Years of education: Not on file   Highest education level: Not on file  Occupational History   Not on file  Tobacco Use   Smoking status: Never   Smokeless tobacco: Never  Substance and Sexual Activity   Alcohol use: No   Drug use: No   Sexual activity: Yes  Other Topics Concern   Not on file  Social History Narrative   Not on file   Social Determinants of Health   Financial Resource Strain: Not on file  Food Insecurity: Not on file  Transportation Needs: Not on file  Physical Activity: Not on file  Stress: Not on file  Social Connections: Not on file  Intimate Partner Violence: Not on file    Review of Systems  Constitutional:  Positive for malaise/fatigue. Negative for chills, fever and weight loss.  Respiratory:  Negative for shortness of breath.   Cardiovascular:  Positive for  palpitations (with worry). Negative for chest pain.  Gastrointestinal:  Negative for abdominal pain, blood in stool and nausea.  Genitourinary:  Negative for hematuria.  Neurological:  Negative for dizziness and loss of consciousness.  Psychiatric/Behavioral:  Negative for depression and suicidal ideas.         Objective    BP 112/74   Pulse 66   Temp 98.4 F (36.9 C) (Temporal)   Ht 5\' 8"  (1.727 m)   Wt 172 lb 2 oz (78.1 kg)   LMP 12/25/2022   SpO2 99%   BMI 26.17 kg/m      01/20/2023    2:32 PM  PHQ9 SCORE ONLY  PHQ-9 Total Score 0     Physical Exam Vitals reviewed.   Constitutional:      General: She is not in acute distress.    Appearance: Normal appearance.  HENT:     Head: Normocephalic and atraumatic.  Neck:     Vascular: No carotid bruit.  Cardiovascular:     Rate and Rhythm: Normal rate and regular rhythm.     Pulses: Normal pulses.     Heart sounds: Normal heart sounds.  Pulmonary:     Effort: Pulmonary effort is normal.     Breath sounds: Normal breath sounds.  Skin:    General: Skin is warm and dry.  Neurological:     General: No focal deficit present.     Mental Status: She is alert and oriented to person, place, and time.  Psychiatric:        Mood and Affect: Mood normal.        Behavior: Behavior normal.        Judgment: Judgment normal.         Assessment & Plan:   Problem List Items Addressed This Visit       Other   Palpitation    Only mentioned this during ROS. Notices it only when worried. For now check labs, and further recommendations may be made based on results. Consider eval with cardiology or ambulatory cardiac monitor in the future.       Anemia    Check cbc, ferritin, iron levels today. Further recommendations may be made based on these results.        Relevant Orders   Ferritin   Iron   Fatigue - Primary    Etiology unclear, start with ordering labs. Further recommendations may be made based on these results.        Relevant Orders   CBC   Comprehensive metabolic panel   Hemoglobin A1c   Lipid panel   TSH   T3, free   T4, free   Vitamin B12   hCG, serum, qualitative   Other Visit Diagnoses     Encounter for hepatitis C screening test for low risk patient       Relevant Orders   Hepatitis C antibody       Return in about 1 month (around 02/20/2023) for F/U with Judson Roch.   Ailene Ards, NP

## 2023-01-20 NOTE — Assessment & Plan Note (Signed)
Etiology unclear, start with ordering labs. Further recommendations may be made based on these results.

## 2023-02-04 ENCOUNTER — Ambulatory Visit: Payer: BLUE CROSS/BLUE SHIELD | Admitting: Nurse Practitioner

## 2023-02-18 ENCOUNTER — Ambulatory Visit: Payer: BLUE CROSS/BLUE SHIELD | Admitting: Nurse Practitioner

## 2023-02-21 ENCOUNTER — Encounter (HOSPITAL_BASED_OUTPATIENT_CLINIC_OR_DEPARTMENT_OTHER): Payer: Self-pay

## 2023-02-21 ENCOUNTER — Emergency Department (HOSPITAL_BASED_OUTPATIENT_CLINIC_OR_DEPARTMENT_OTHER)
Admission: EM | Admit: 2023-02-21 | Discharge: 2023-02-21 | Disposition: A | Discharge status: Home / Self Care | Payer: BLUE CROSS/BLUE SHIELD | Attending: Emergency Medicine | Admitting: Emergency Medicine

## 2023-02-21 ENCOUNTER — Other Ambulatory Visit: Payer: Self-pay

## 2023-02-21 DIAGNOSIS — H5711 Ocular pain, right eye: Secondary | ICD-10-CM | POA: Diagnosis present

## 2023-02-21 DIAGNOSIS — S0502XA Injury of conjunctiva and corneal abrasion without foreign body, left eye, initial encounter: Secondary | ICD-10-CM | POA: Insufficient documentation

## 2023-02-21 DIAGNOSIS — S0501XA Injury of conjunctiva and corneal abrasion without foreign body, right eye, initial encounter: Secondary | ICD-10-CM | POA: Insufficient documentation

## 2023-02-21 DIAGNOSIS — S0500XA Injury of conjunctiva and corneal abrasion without foreign body, unspecified eye, initial encounter: Secondary | ICD-10-CM

## 2023-02-21 DIAGNOSIS — X58XXXA Exposure to other specified factors, initial encounter: Secondary | ICD-10-CM | POA: Diagnosis not present

## 2023-02-21 MED ORDER — POLYMYXIN B-TRIMETHOPRIM 10000-0.1 UNIT/ML-% OP SOLN: 1.0000 [drp] | OPHTHALMIC | 0 refills | Status: DC

## 2023-02-21 MED ORDER — FLUORESCEIN SODIUM 1 MG OP STRP
1.0000 | ORAL_STRIP | Freq: Once | OPHTHALMIC | Status: DC
  Filled 2023-02-21: qty 1

## 2023-02-21 MED ORDER — HYDROXYZINE HCL 25 MG PO TABS: 25.0000 mg | ORAL_TABLET | Freq: Every day | ORAL | 0 refills | Status: DC

## 2023-02-21 MED ORDER — TETRACAINE HCL 0.5 % OP SOLN
2.0000 [drp] | Freq: Once | OPHTHALMIC | Status: DC
  Filled 2023-02-21: qty 4

## 2023-02-21 NOTE — ED Notes (Signed)
Pt verbalized understanding of d/c instructions, meds, and followup care. Denies questions. VSS, no distress noted. Steady gait to exit with all belongings.  ?

## 2023-02-21 NOTE — Discharge Instructions (Signed)
Follow up with your doctor in the office.  

## 2023-02-21 NOTE — ED Triage Notes (Signed)
Patient here POV from Home.  Endorses Bilateral Eye Pain and Irritation that began at 0800 today.   OTC Medication was somewhat effective in draining eyes but still endorses discomfort. No Drainage noted. Associated with headache. No Changes in Vision but had some Pain and discomfort to Right Eye upon opening.   NAD Noted during triage. A&Ox4. Gcs 15. Ambulatory.

## 2023-02-21 NOTE — ED Provider Notes (Signed)
[place] EMERGENCY DEPARTMENT AT [facility] Provider Note   CSN: [id number] Arrival date & time: 02/21/23  2002     History  Chief Complaint  Patient presents with   Eye Pain    Sabrina [name] is a 47 y.o. female.  47 yo F with a chief complaints of bilateral eye pain.  Worse on the right than the left.  Going on for a couple days.  She denies any obvious injury she thinks maybe she got some dust stuck in there.  Has been watering mostly on the right than the left.  Denies contact lens use.  Denies welding or woodworking.  Denies metalworking.   Eye Pain       Home Medications Prior to Admission medications   Medication Sig Start Date End Date Taking? Authorizing Provider  hydrOXYzine (ATARAX) 25 MG tablet Take 1 tablet (25 mg total) by mouth at bedtime. 02/21/23  Yes Melene Plan, DO  trimethoprim-polymyxin b (POLYTRIM) ophthalmic solution Place 1 drop into the right eye every 4 (four) hours. 02/21/23  Yes Melene Plan, DO  pantoprazole (PROTONIX) 20 MG tablet Take 1 tablet (20 mg total) by mouth 2 (two) times daily. 08/22/17 09/05/17  Shaune Pollack, MD      Allergies    Patient has no known allergies.    Review of Systems   Review of Systems  Eyes:  Positive for pain.    Physical Exam Updated Vital Signs BP 138/78   Pulse 77   Temp 97.7 F (36.5 C) (Oral)   Resp 18   Ht 5\' 8"  (1.727 m)   Wt 78.1 kg   SpO2 98%   BMI 26.18 kg/m  Physical Exam Vitals and nursing note reviewed.  Constitutional:      General: She is not in acute distress.    Appearance: She is well-developed. She is not diaphoretic.  HENT:     Head: Normocephalic and atraumatic.  Eyes:     General:        Right eye: No foreign body or discharge.        Left eye: No foreign body or discharge.     Pupils: Pupils are equal, round, and reactive to light.     Right eye: Corneal abrasion present.     Left eye: Corneal abrasion present.     Comments: Corneal abrasion bilaterally, both  to the inferior aspect of the iris about the 6 o'clock position.  No appreciable foreign body on fluorescein EOM intact  Cardiovascular:     Rate and Rhythm: Normal rate and regular rhythm.     Heart sounds: No murmur heard.    No friction rub. No gallop.  Pulmonary:     Effort: Pulmonary effort is normal.     Breath sounds: No wheezing or rales.  Abdominal:     General: There is no distension.     Palpations: Abdomen is soft.     Tenderness: There is no abdominal tenderness.  Musculoskeletal:        General: No tenderness.     Cervical back: Normal range of motion and neck supple.  Skin:    General: Skin is warm and dry.  Neurological:     Mental Status: She is alert and oriented to person, place, and time.  Psychiatric:        Behavior: Behavior normal.     ED Results / Procedures / Treatments   Labs (all labs ordered are listed, but only abnormal results are displayed) Labs Reviewed -  No data to display  EKG None  Radiology No results found.  Procedures Procedures    Medications Ordered in ED Medications  tetracaine (PONTOCAINE) 0.5 % ophthalmic solution 2 drop (has no administration in time range)  fluorescein ophthalmic strip 1 strip (has no administration in time range)    ED Course/ Medical Decision Making/ A&P                             Medical Decision Making Risk Prescription drug management.   47 yo F with a chief complaints of bilateral eye pain.  Going on for couple days.  She thinks some dust got into them.  It sounds like she has been rubbing her eyes quite a bit.  She has a corneal abrasion bilaterally.  Will start on topical antibiotic drops.  Have her follow-up with ophthalmologist in the office.  9:43 PM:  I have discussed the diagnosis/risks/treatment options with the patient.  Evaluation and diagnostic testing in the emergency department does not suggest an emergent condition requiring admission or immediate intervention beyond what has  been performed at this time.  They will follow up with PCP, optho. We also discussed returning to the ED immediately if new or worsening sx occur. We discussed the sx which are most concerning (e.g., sudden worsening pain, fever, inability to tolerate by mouth) that necessitate immediate return. Medications administered to the patient during their visit and any new prescriptions provided to the patient are listed below.  Medications given during this visit Medications  tetracaine (PONTOCAINE) 0.5 % ophthalmic solution 2 drop (has no administration in time range)  fluorescein ophthalmic strip 1 strip (has no administration in time range)     The patient appears reasonably screen and/or stabilized for discharge and I doubt any other medical condition or other [facility] requiring further screening, evaluation, or treatment in the ED at this time prior to discharge.          Final Clinical Impression(s) / ED Diagnoses Final diagnoses:  Corneal abrasion, unspecified laterality, initial encounter    Rx / DC Orders ED Discharge Orders          Ordered    trimethoprim-polymyxin b (POLYTRIM) ophthalmic solution  Every 4 hours        02/21/23 2139    hydrOXYzine (ATARAX) 25 MG tablet  Daily at bedtime        02/21/23 2139              Melene Plan, DO 02/21/23 2143

## 2023-02-22 ENCOUNTER — Telehealth: Payer: Self-pay

## 2023-02-22 NOTE — Transitions of Care (Post Inpatient/ED Visit) (Signed)
   02/22/2023  Name: Sabrina [name] MRN: [id number] DOB: 1976/06/10  Today's TOC FU Call Status: Today's TOC FU Call Status:: Successful TOC FU Call Competed Unsuccessful Call (1st Attempt) Date: 02/22/23 [facility] FU Call Complete Date: 02/22/23  Transition Care Management Follow-up Telephone Call Date of Discharge: 02/21/23 Discharge Facility: [facility] ([facility] Type of Discharge: Emergency Department Reason for ED Visit: Other: (corneal abrasion) Any questions or concerns?: No  Items Reviewed: Did you receive and understand the discharge instructions provided?: Yes Medications obtained and verified?: Yes (Medications Reviewed) Any new allergies since your discharge?: No Dietary orders reviewed?: Yes Do you have support at home?: Yes People in Home: spouse  Home Care and Equipment/Supplies: Were Home Health Services Ordered?: NA Any new equipment or medical supplies ordered?: NA  Functional Questionnaire: Do you need assistance with bathing/showering or dressing?: No Do you need assistance with meal preparation?: No Do you need assistance with eating?: No Do you have difficulty maintaining continence: No Do you need assistance with getting out of bed/getting out of a chair/moving?: No Do you have difficulty managing or taking your medications?: No  Follow up appointments reviewed: PCP Follow-up appointment confirmed?: NA Specialist Hospital Follow-up appointment confirmed?: NA Do you need transportation to your follow-up appointment?: No Do you understand care options if your condition(s) worsen?: Yes-patient verbalized understanding    SIGNATURE Karena Addison, LPN [facility] Nurse Health Advisor Direct Dial [phone]

## 2023-02-22 NOTE — Transitions of Care (Post Inpatient/ED Visit) (Signed)
   02/22/2023  Name: Anaisabel Pederson MRN: [id number] DOB: Jan 05, 1976  Today's TOC FU Call Status: Today's TOC FU Call Status:: Unsuccessul Call (1st Attempt) Unsuccessful Call (1st Attempt) Date: 02/22/23  Attempted to reach the patient regarding the most recent Inpatient/ED visit.  Follow Up Plan: Additional outreach attempts will be made to reach the patient to complete the Transitions of Care (Post Inpatient/ED visit) call.   Signature Karena Addison, LPN [facility] Nurse Health Advisor Direct Dial [phone]

## 2023-03-03 ENCOUNTER — Ambulatory Visit: Payer: BLUE CROSS/BLUE SHIELD | Admitting: Nurse Practitioner

## 2023-03-11 ENCOUNTER — Ambulatory Visit: Payer: BLUE CROSS/BLUE SHIELD | Admitting: Nurse Practitioner

## 2023-03-14 ENCOUNTER — Encounter: Payer: Self-pay | Admitting: Family Medicine

## 2023-03-14 ENCOUNTER — Ambulatory Visit (INDEPENDENT_AMBULATORY_CARE_PROVIDER_SITE_OTHER): Payer: BLUE CROSS/BLUE SHIELD | Admitting: Family Medicine

## 2023-03-14 VITALS — BP 110/68 | HR 74 | Temp 97.7°F | Resp 20 | Ht 68.0 in | Wt 175.0 lb

## 2023-03-14 DIAGNOSIS — R635 Abnormal weight gain: Secondary | ICD-10-CM

## 2023-03-14 DIAGNOSIS — L659 Nonscarring hair loss, unspecified: Secondary | ICD-10-CM

## 2023-03-14 NOTE — Progress Notes (Signed)
   Assessment & Plan:  1. Abnormal weight gain Patient has gained 15 lbs over the past 4 months.  Encouraged her to continue healthy eating and increase her exercise.  Advised I am unable to start her on medication for weight loss with a BMI less than 30.  Education provided on obesity for its content. - Amb Ref to Medical Weight Management  2. Hair thinning Encourage patient to take a supplement such as biotin or skin hair or nails.   Follow up plan: Return if symptoms worsen or fail to improve.  Deliah Boston, MSN, APRN, FNP-C  Subjective:  HPI: Sabrina [name] is a 47 y.o. female presenting on 03/14/2023 for Weight Check (Patient would like to discuss weight loss medication options )  Patient reports she has been gaining weight over the past nine months. Previous labs have all been normal. She is walking for exercise 30-60 minutes 3x/week. She is making healthy food choices, but does admit that she eats more often than she would like because she eats with her children and they eat at different times of the day. She would like a medication to help with weight loss.  She is also concerned that her hair has been thinning over the past six months. No recent childbirths, illnesses, depression, anxiety, or increased stress.     ROS: Negative unless specifically indicated above in HPI.   Relevant past medical history reviewed and updated as indicated.   Allergies and medications reviewed and updated.  No current outpatient medications on file.  No Known Allergies  Objective:   BP 110/68   Pulse 74   Temp 97.7 F (36.5 C)   Resp 20   Ht 5\' 8"  (1.727 m)   Wt 175 lb (79.4 kg)   LMP 02/12/2023 (Approximate)   Breastfeeding No Comment: 03/14/23 - periods are starting to become abnormal - misses a month every now and then  BMI 26.61 kg/m    Physical Exam Vitals reviewed.  Constitutional:      General: She is not in acute distress.    Appearance: Normal appearance. She is not  ill-appearing, toxic-appearing or diaphoretic.  HENT:     Head: Normocephalic and atraumatic.  Eyes:     General: No scleral icterus.       Right eye: No discharge.        Left eye: No discharge.     Conjunctiva/sclera: Conjunctivae normal.  Cardiovascular:     Rate and Rhythm: Normal rate.  Pulmonary:     Effort: Pulmonary effort is normal. No respiratory distress.  Musculoskeletal:        General: Normal range of motion.     Cervical back: Normal range of motion.  Skin:    General: Skin is warm and dry.     Capillary Refill: Capillary refill takes less than 2 seconds.  Neurological:     General: No focal deficit present.     Mental Status: She is alert and oriented to person, place, and time. Mental status is at baseline.  Psychiatric:        Mood and Affect: Mood normal.        Behavior: Behavior normal.        Thought Content: Thought content normal.        Judgment: Judgment normal.

## 2023-03-14 NOTE — Patient Instructions (Signed)
Biotin or Skin, Hair & Nails to help with your hair loss.

## 2023-08-14 IMAGING — DX DG CHEST 2V
2 series · 2 of 2 positions shown · non-contrast
Comparison: None.

CLINICAL DATA: chest pain

EXAM:
CHEST - 2 VIEW

[chest pa]
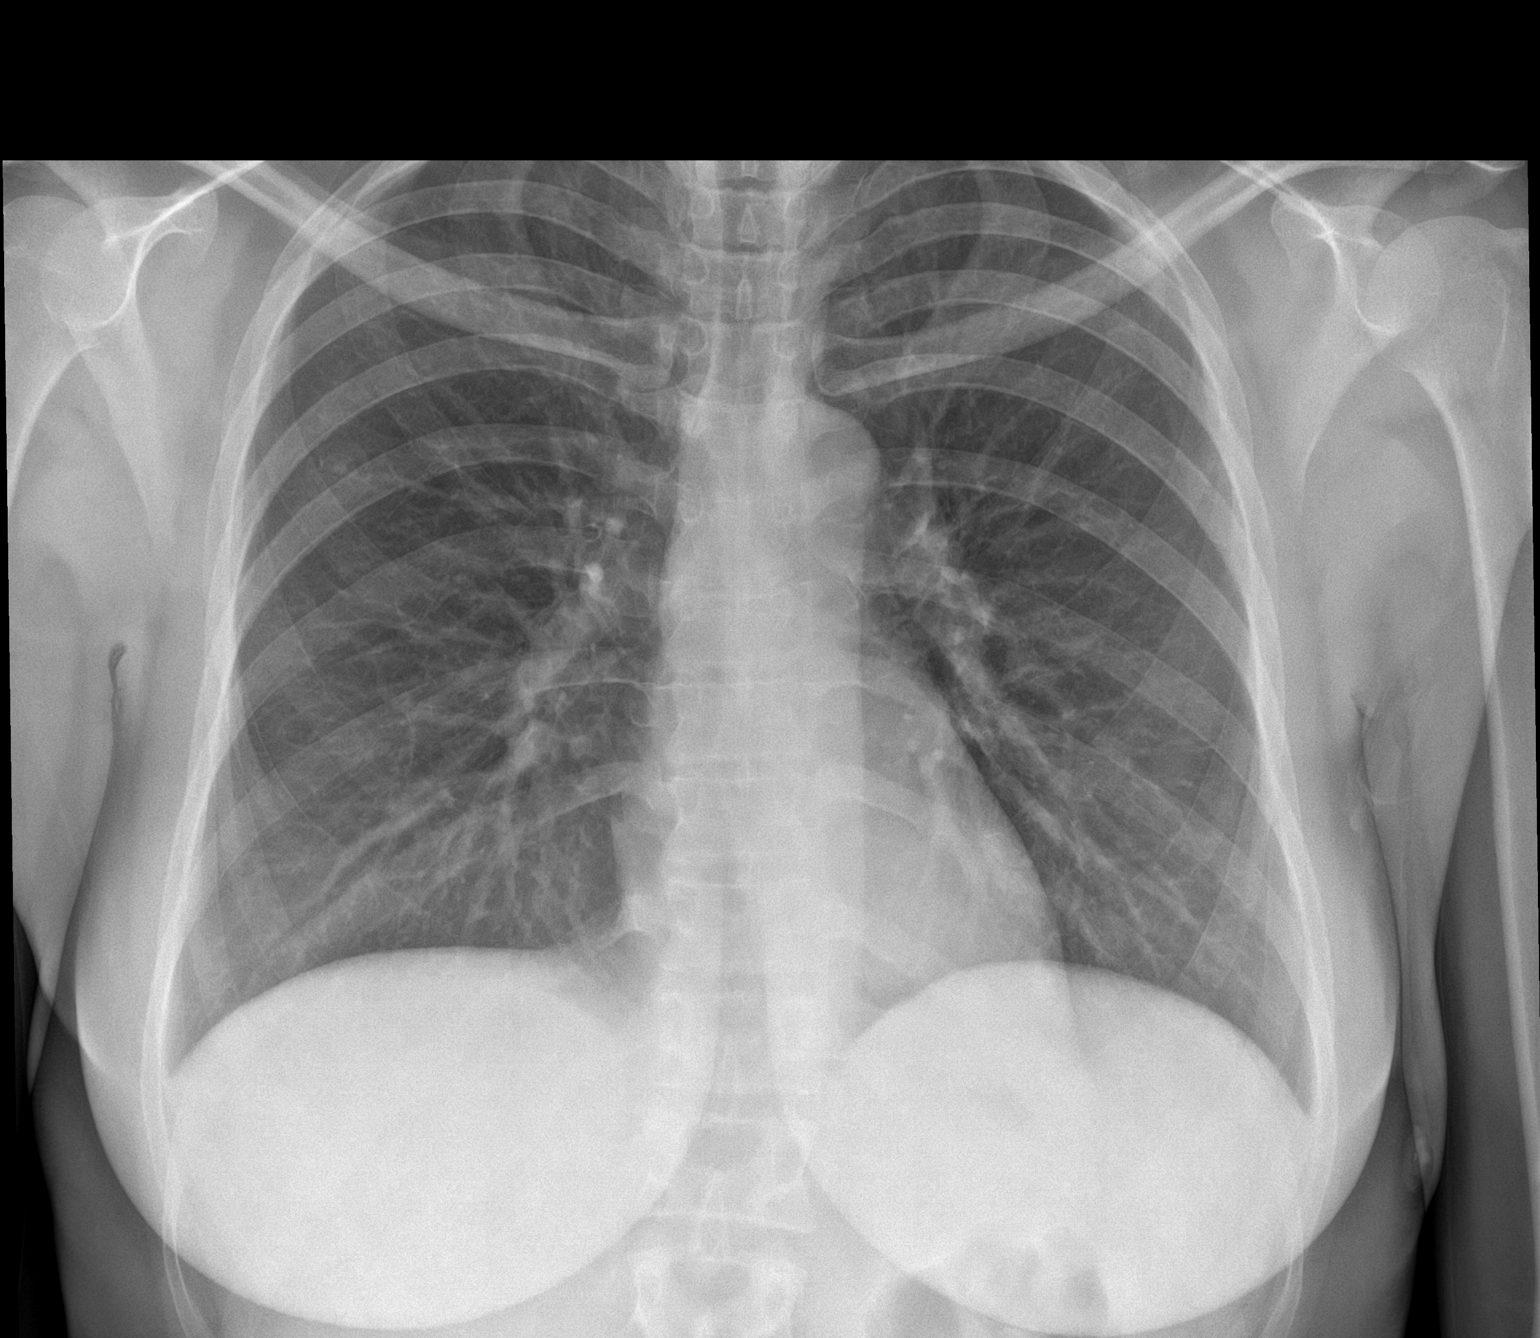

[chest lat]
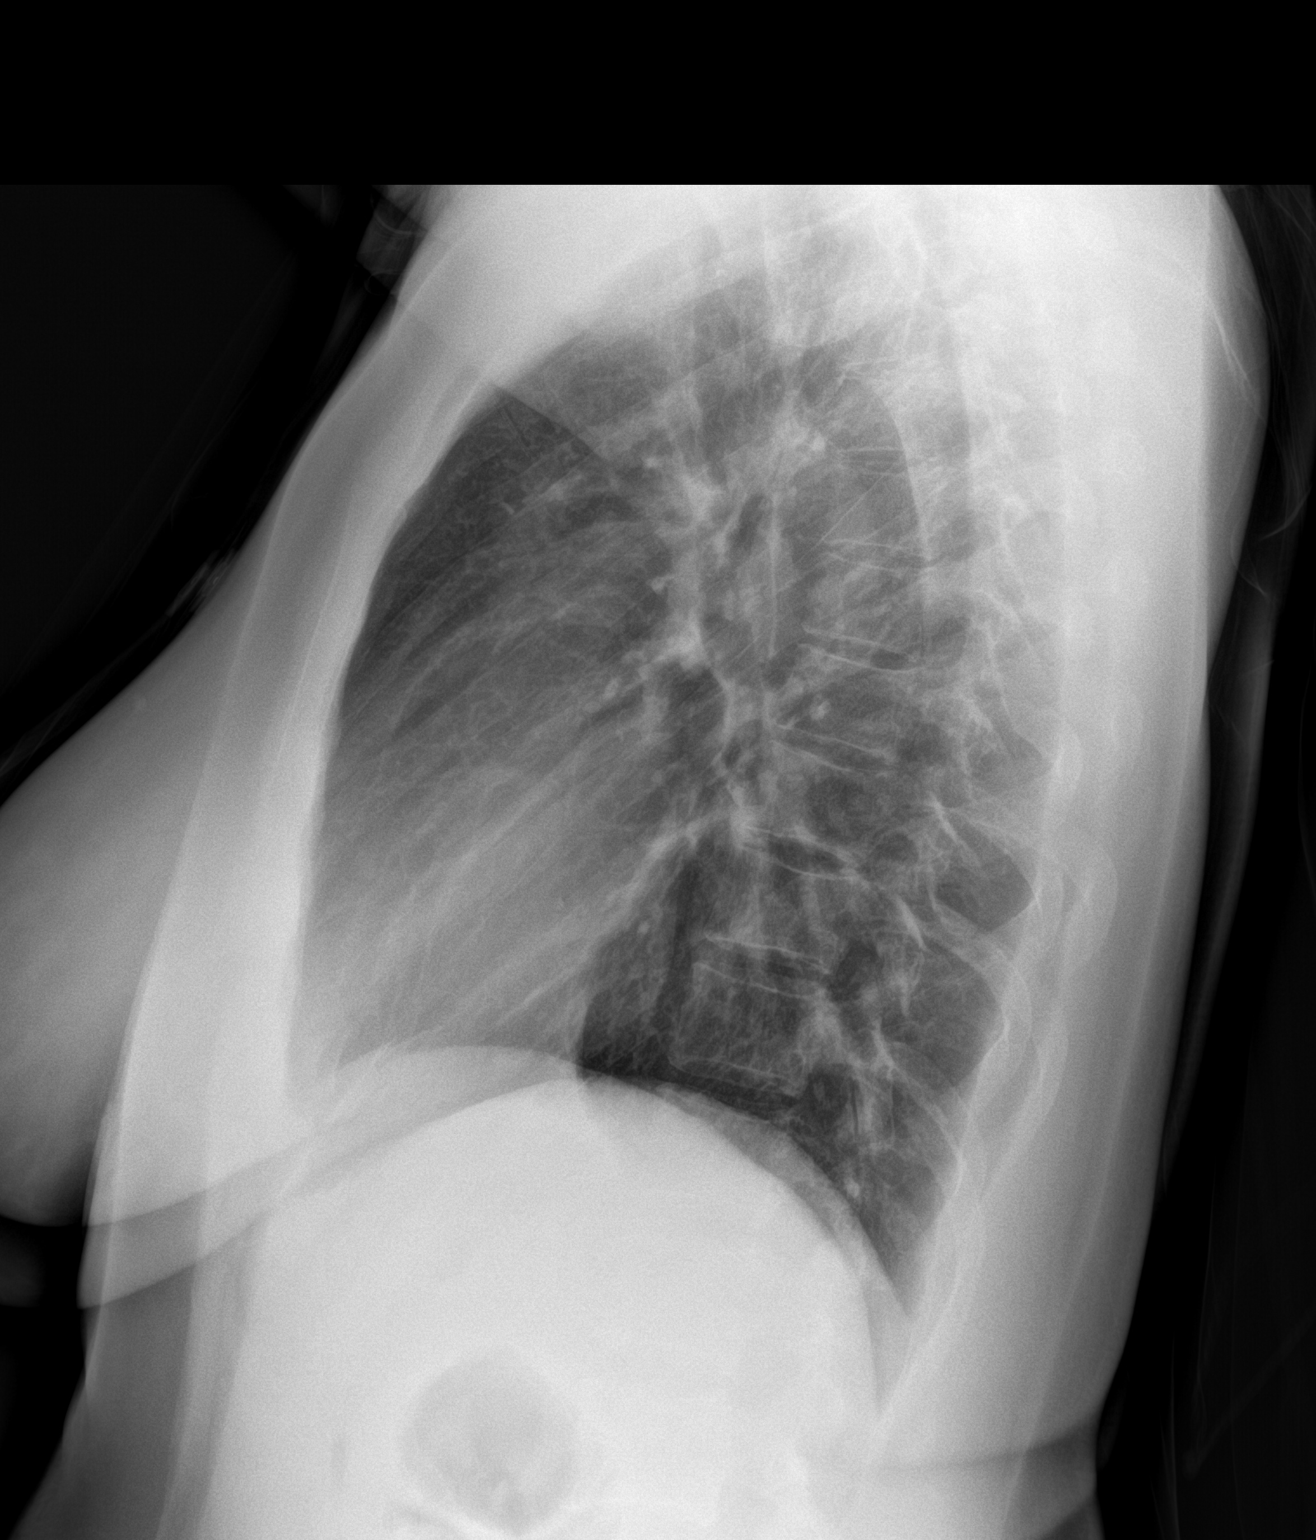

[2 of 2 positions shown; findings below may reference images not displayed]

FINDINGS: No consolidation. No visible pleural effusions or pneumothorax.
Cardiomediastinal silhouette is within normal limits. No acute
osseous abnormality.
IMPRESSION: No evidence of acute cardiopulmonary disease.
